# Patient Record
Sex: Male | Born: 1970 | Race: White | Hispanic: No | Smoking: Former smoker
Health system: Southern US, Community
[De-identification: ages and names within clinical notes are randomized; demographics above are authoritative.]

---

## 2007-11-05 ENCOUNTER — Inpatient Hospital Stay (HOSPITAL_COMMUNITY): Admission: EM | Admit: 2007-11-05 | Discharge: 2007-11-08 | Payer: Self-pay | Admitting: Emergency Medicine

## 2008-07-20 ENCOUNTER — Encounter: Admission: RE | Admit: 2008-07-20 | Discharge: 2008-07-20 | Payer: Self-pay | Admitting: Specialist

## 2009-01-27 ENCOUNTER — Encounter: Admission: RE | Admit: 2009-01-27 | Discharge: 2009-01-27 | Payer: Self-pay | Admitting: Family Medicine

## 2010-10-02 IMAGING — CT CT L SPINE W/O CM
3 of 6 series · 14 of 29 positions shown, 15 images · non-contrast
Comparison: Myelogram 07/20/2008

CLINICAL DATA: Follow-up discogram at outside institution.  Back
pain.

CT LUMBAR SPINE WITHOUT CONTRAST
TECHNIQUE: Multidetector CT imaging of the lumbar spine was
performed without intravenous contrast administration. Multiplanar
CT image reconstructions were also generated.

[Series 2: l-spine helical · axial · 0.27mm/px · z∈[-62,+21]mm · 4 of 55 slices shown, 5 images]
[im 11/55  soft-tissue]
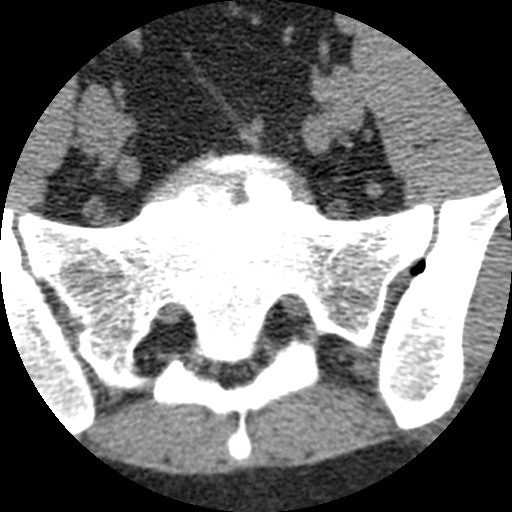
[im 11/55  bone]
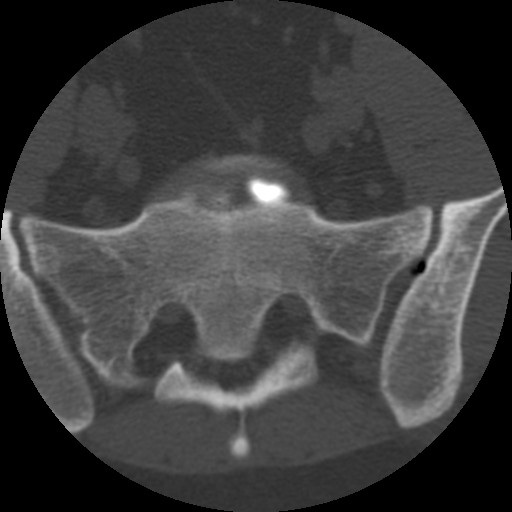
[im 22/55  bone]
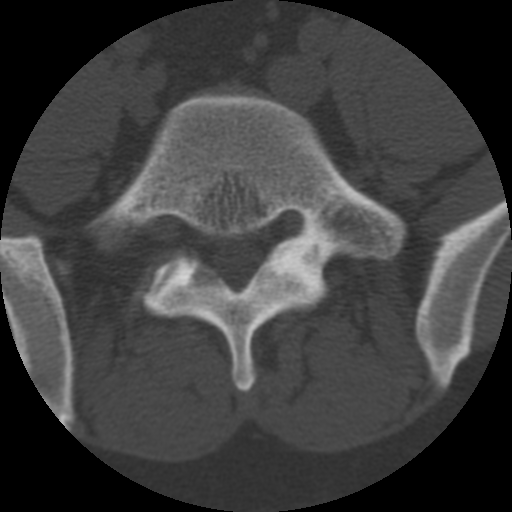
[im 33/55  bone]
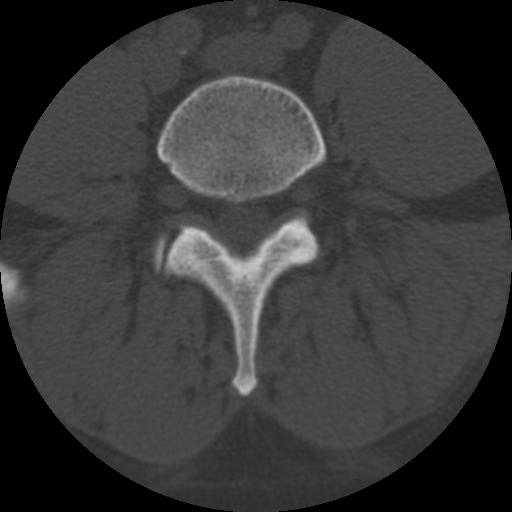
[im 44/55  bone]
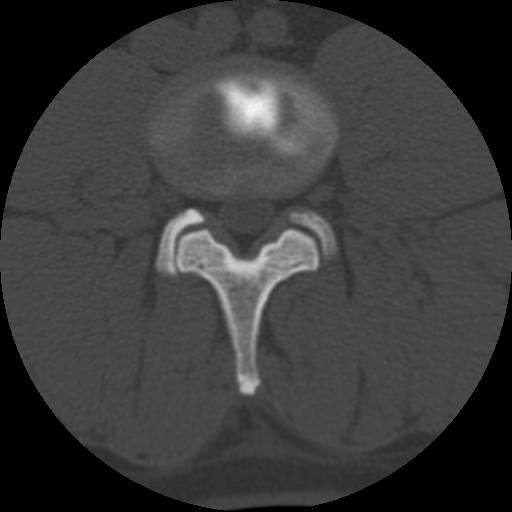

[Series 3: bone windows · axial · 0.27mm/px · z∈[-62,+21]mm · 4 of 55 slices shown]
[im 11/55  bone]
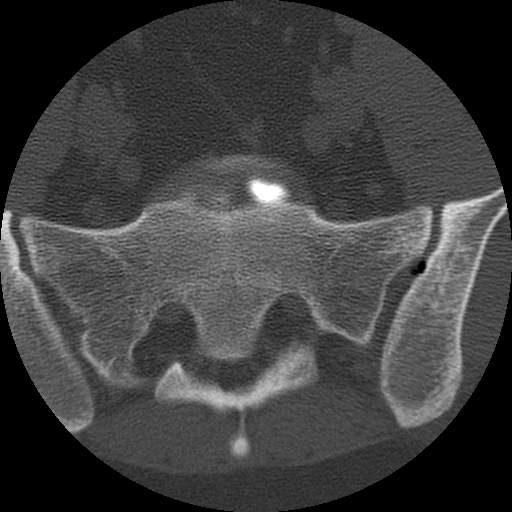
[im 22/55  bone]
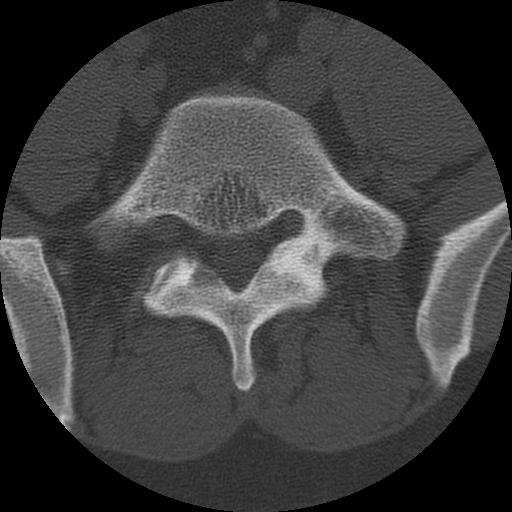
[im 33/55  bone]
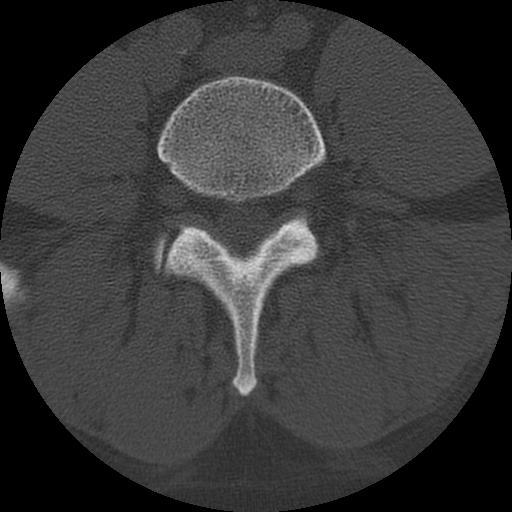
[im 44/55  bone]
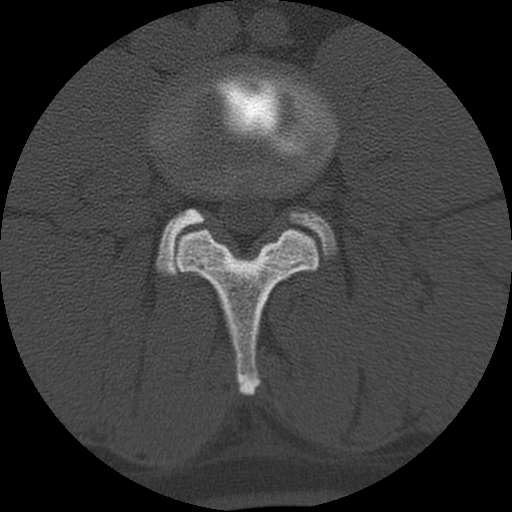

[Series 400: coronal · coronal · 0.27mm/px · 6 of 40 slices shown]
[im 2/40  soft-tissue]
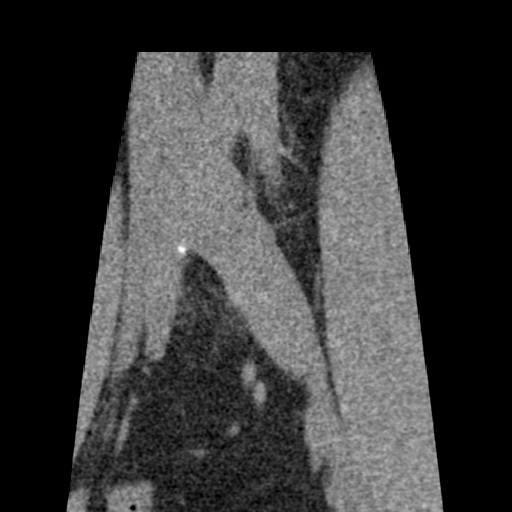
[im 7/40  bone]
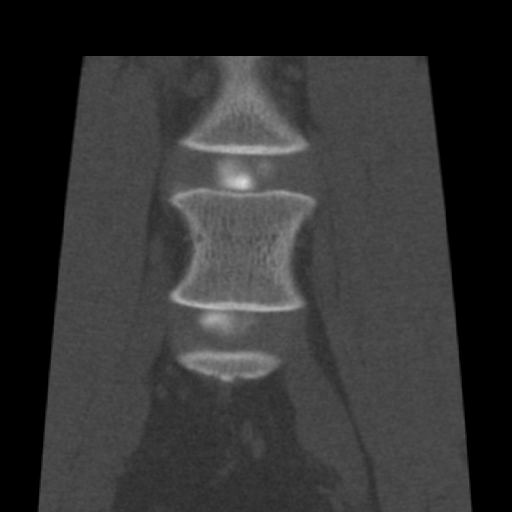
[im 14/40  bone]
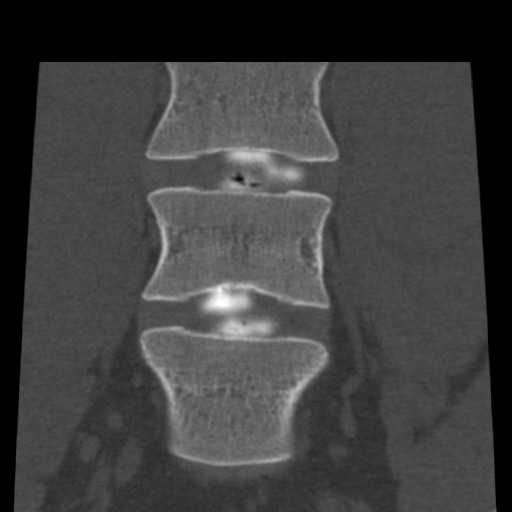
[im 20/40  bone]
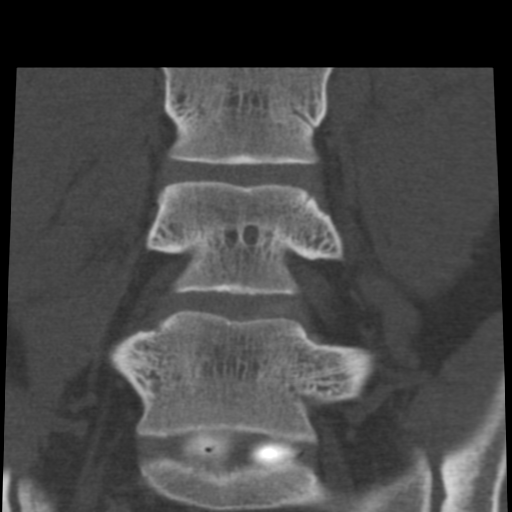
[im 27/40  bone]
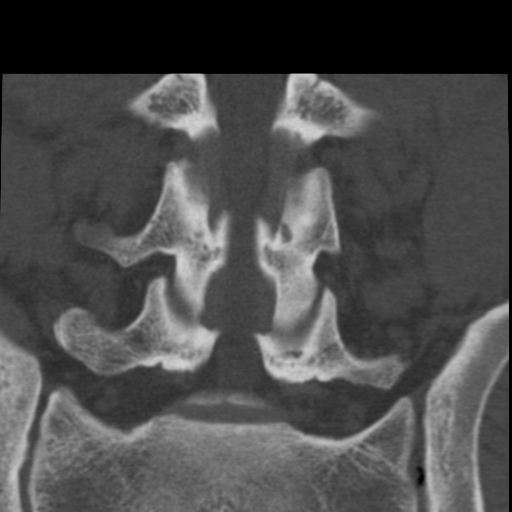
[im 33/40  bone]
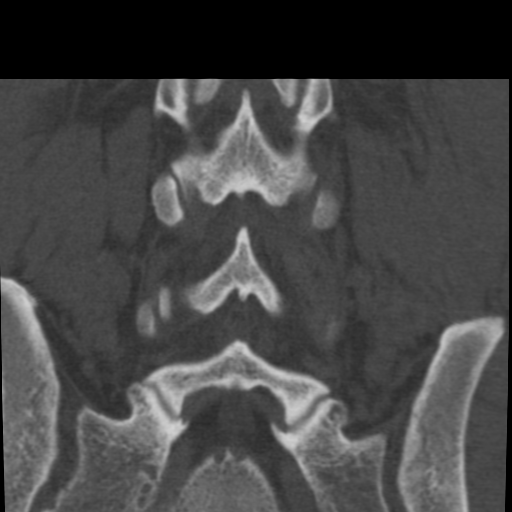

[14 of 29 positions shown; findings below may reference images not displayed]

FINDINGS: L3-4:  Normal nuclear pattern except for a small anterior
annular tear.  Canal and foramina widely patent.

L4-5:  Normal nuclear pattern.  Canal and foramina widely patent.

L5-S1:  The injection is partially annular on the left.  Contrast
does enter the nuclear region.  There appears to be annular tearing
towards the right at about seven to eight o'clock.  No canal or
foraminal compromise.
IMPRESSION: Normal nuclear patterns at L3-4 and L4-5 with the exception of some
anterior annular tearing at L3-4.

The L5-S1 injection is partially annular.  Contrast in the nuclear
region appears to demonstrate an annular tear on the right at seven
to eight o'clock.
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
## 2011-06-14 LAB — CBC
HCT: 41.5
Hemoglobin: 14.2
MCHC: 34.3
Platelets: 212
RDW: 12.4

## 2011-06-14 LAB — DIFFERENTIAL
Basophils Absolute: 0
Basophils Relative: 0
Eosinophils Absolute: 0
Lymphs Abs: 1.5
Monocytes Relative: 7
Neutro Abs: 8 — ABNORMAL HIGH
Neutrophils Relative %: 78 — ABNORMAL HIGH

## 2011-06-14 LAB — PROTIME-INR: Prothrombin Time: 12.2

## 2011-06-14 LAB — I-STAT 8, (EC8 V) (CONVERTED LAB)
BUN: 9
Bicarbonate: 26.8 — ABNORMAL HIGH
Chloride: 105
Glucose, Bld: 102 — ABNORMAL HIGH
HCT: 44
Hemoglobin: 15
Operator id: 222501
Potassium: 4.1
Sodium: 139
TCO2: 28
pCO2, Ven: 49.8
pH, Ven: 7.338 — ABNORMAL HIGH

## 2011-06-14 LAB — POCT I-STAT CREATININE
Creatinine, Ser: 0.9
Operator id: 222501

## 2011-06-14 LAB — ABO/RH: ABO/RH(D): A POS

## 2011-06-14 LAB — TYPE AND SCREEN: Antibody Screen: NEGATIVE

## 2013-05-14 DIAGNOSIS — M6140 Other calcification of muscle, unspecified site: Secondary | ICD-10-CM

## 2013-05-14 DIAGNOSIS — M5137 Other intervertebral disc degeneration, lumbosacral region: Secondary | ICD-10-CM

## 2013-05-14 DIAGNOSIS — F411 Generalized anxiety disorder: Secondary | ICD-10-CM

## 2013-05-14 DIAGNOSIS — K769 Liver disease, unspecified: Secondary | ICD-10-CM

## 2013-05-14 HISTORY — DX: Other calcification of muscle, unspecified site: M61.40

## 2013-05-14 HISTORY — DX: Liver disease, unspecified: K76.9

## 2013-05-14 HISTORY — DX: Other intervertebral disc degeneration, lumbosacral region: M51.37

## 2013-05-14 HISTORY — DX: Generalized anxiety disorder: F41.1

## 2013-06-16 DIAGNOSIS — F341 Dysthymic disorder: Secondary | ICD-10-CM | POA: Insufficient documentation

## 2013-06-16 DIAGNOSIS — F4329 Adjustment disorder with other symptoms: Secondary | ICD-10-CM

## 2013-06-16 HISTORY — DX: Dysthymic disorder: F34.1

## 2013-06-16 HISTORY — DX: Adjustment disorder with other symptoms: F43.29

## 2013-09-03 DIAGNOSIS — M545 Low back pain, unspecified: Secondary | ICD-10-CM

## 2013-09-03 DIAGNOSIS — M79609 Pain in unspecified limb: Secondary | ICD-10-CM

## 2013-09-03 HISTORY — DX: Pain in unspecified limb: M79.609

## 2013-09-03 HISTORY — DX: Low back pain, unspecified: M54.50

## 2013-09-10 DIAGNOSIS — F4541 Pain disorder exclusively related to psychological factors: Secondary | ICD-10-CM | POA: Insufficient documentation

## 2013-09-10 DIAGNOSIS — Z09 Encounter for follow-up examination after completed treatment for conditions other than malignant neoplasm: Secondary | ICD-10-CM | POA: Insufficient documentation

## 2013-09-10 DIAGNOSIS — F4542 Pain disorder with related psychological factors: Secondary | ICD-10-CM | POA: Insufficient documentation

## 2013-09-10 HISTORY — DX: Pain disorder exclusively related to psychological factors: F45.41

## 2013-09-10 HISTORY — DX: Pain disorder with related psychological factors: F45.42

## 2013-09-10 HISTORY — DX: Encounter for follow-up examination after completed treatment for conditions other than malignant neoplasm: Z09

## 2013-10-04 DIAGNOSIS — G894 Chronic pain syndrome: Secondary | ICD-10-CM

## 2013-10-04 HISTORY — DX: Chronic pain syndrome: G89.4

## 2014-03-31 DIAGNOSIS — R29898 Other symptoms and signs involving the musculoskeletal system: Secondary | ICD-10-CM

## 2014-03-31 DIAGNOSIS — M6289 Other specified disorders of muscle: Secondary | ICD-10-CM

## 2014-03-31 DIAGNOSIS — M25551 Pain in right hip: Secondary | ICD-10-CM

## 2014-03-31 HISTORY — DX: Other symptoms and signs involving the musculoskeletal system: R29.898

## 2014-03-31 HISTORY — DX: Other specified disorders of muscle: M62.89

## 2014-03-31 HISTORY — DX: Pain in right hip: M25.551

## 2014-07-26 DIAGNOSIS — Z0289 Encounter for other administrative examinations: Secondary | ICD-10-CM | POA: Insufficient documentation

## 2014-07-26 HISTORY — DX: Encounter for other administrative examinations: Z02.89

## 2014-09-28 DIAGNOSIS — Z5181 Encounter for therapeutic drug level monitoring: Secondary | ICD-10-CM

## 2014-09-28 HISTORY — DX: Encounter for therapeutic drug level monitoring: Z51.81

## 2016-01-15 DIAGNOSIS — G250 Essential tremor: Secondary | ICD-10-CM | POA: Insufficient documentation

## 2016-01-15 HISTORY — DX: Essential tremor: G25.0

## 2017-03-07 DIAGNOSIS — I10 Essential (primary) hypertension: Secondary | ICD-10-CM

## 2017-03-07 HISTORY — DX: Essential (primary) hypertension: I10

## 2017-08-08 DIAGNOSIS — D239 Other benign neoplasm of skin, unspecified: Secondary | ICD-10-CM

## 2017-08-08 HISTORY — DX: Other benign neoplasm of skin, unspecified: D23.9

## 2017-09-22 DIAGNOSIS — Z9689 Presence of other specified functional implants: Secondary | ICD-10-CM | POA: Insufficient documentation

## 2017-09-22 HISTORY — DX: Presence of other specified functional implants: Z96.89

## 2021-01-16 DIAGNOSIS — E785 Hyperlipidemia, unspecified: Secondary | ICD-10-CM | POA: Diagnosis not present

## 2021-01-16 DIAGNOSIS — R9439 Abnormal result of other cardiovascular function study: Secondary | ICD-10-CM | POA: Diagnosis not present

## 2021-01-16 DIAGNOSIS — R079 Chest pain, unspecified: Secondary | ICD-10-CM | POA: Diagnosis not present

## 2021-01-16 DIAGNOSIS — Z720 Tobacco use: Secondary | ICD-10-CM | POA: Diagnosis not present

## 2021-01-17 ENCOUNTER — Encounter (HOSPITAL_COMMUNITY): Admission: RE | Payer: Self-pay | Source: Home / Self Care

## 2021-01-17 ENCOUNTER — Ambulatory Visit (HOSPITAL_COMMUNITY): Admission: RE | Admit: 2021-01-17 | Payer: Medicare HMO | Source: Home / Self Care | Admitting: Cardiovascular Disease

## 2021-01-17 ENCOUNTER — Inpatient Hospital Stay
Admission: AD | Admit: 2021-01-17 | Payer: Medicare HMO | Source: Other Acute Inpatient Hospital | Admitting: Cardiovascular Disease

## 2021-01-17 DIAGNOSIS — R55 Syncope and collapse: Secondary | ICD-10-CM | POA: Diagnosis not present

## 2021-01-17 DIAGNOSIS — Z720 Tobacco use: Secondary | ICD-10-CM | POA: Diagnosis not present

## 2021-01-17 DIAGNOSIS — E785 Hyperlipidemia, unspecified: Secondary | ICD-10-CM | POA: Diagnosis not present

## 2021-01-17 DIAGNOSIS — R9439 Abnormal result of other cardiovascular function study: Secondary | ICD-10-CM

## 2021-01-17 DIAGNOSIS — R079 Chest pain, unspecified: Secondary | ICD-10-CM | POA: Diagnosis not present

## 2021-01-17 SURGERY — LEFT HEART CATH AND CORONARY ANGIOGRAPHY
Anesthesia: LOCAL

## 2021-01-31 ENCOUNTER — Other Ambulatory Visit: Payer: Self-pay

## 2021-01-31 ENCOUNTER — Ambulatory Visit: Payer: Medicare HMO | Admitting: Cardiology

## 2021-01-31 ENCOUNTER — Ambulatory Visit (INDEPENDENT_AMBULATORY_CARE_PROVIDER_SITE_OTHER): Payer: Medicare HMO

## 2021-01-31 ENCOUNTER — Encounter: Payer: Self-pay | Admitting: Cardiology

## 2021-01-31 VITALS — BP 110/88 | HR 76 | Ht 72.0 in | Wt 245.2 lb

## 2021-01-31 DIAGNOSIS — R9439 Abnormal result of other cardiovascular function study: Secondary | ICD-10-CM

## 2021-01-31 DIAGNOSIS — E782 Mixed hyperlipidemia: Secondary | ICD-10-CM | POA: Insufficient documentation

## 2021-01-31 DIAGNOSIS — R079 Chest pain, unspecified: Secondary | ICD-10-CM

## 2021-01-31 DIAGNOSIS — E669 Obesity, unspecified: Secondary | ICD-10-CM

## 2021-01-31 HISTORY — DX: Abnormal result of other cardiovascular function study: R94.39

## 2021-01-31 HISTORY — DX: Obesity, unspecified: E66.9

## 2021-01-31 HISTORY — DX: Chest pain, unspecified: R07.9

## 2021-01-31 HISTORY — DX: Mixed hyperlipidemia: E78.2

## 2021-01-31 MED ORDER — ASPIRIN EC 81 MG PO TBEC: 81.0000 mg | DELAYED_RELEASE_TABLET | Freq: Every day | ORAL | 3 refills | Status: DC

## 2021-01-31 MED ORDER — ROSUVASTATIN CALCIUM 20 MG PO TABS: 20.0000 mg | ORAL_TABLET | Freq: Every day | ORAL | 3 refills | Status: DC

## 2021-01-31 MED ORDER — ISOSORBIDE MONONITRATE ER 30 MG PO TB24: 15.0000 mg | ORAL_TABLET | Freq: Every day | ORAL | 3 refills | Status: DC

## 2021-01-31 NOTE — Patient Instructions (Addendum)
Medication Instructions:  Your physician has recommended you make the following change in your medication:  START: Crestor 20 mg once daily START: Imdur 15 mg once daily START: Aspirin 81 mg once daily *If you need a refill on your cardiac medications before your next appointment, please call your pharmacy*   Lab Work: Your physician recommends that you return for lab work:  TODAY: BMET, Mag, CBC If you have labs (blood work) drawn today and your tests are completely normal, you will receive your results only by: Marland Kitchen MyChart Message (if you have MyChart) OR . A paper copy in the mail If you have any lab test that is abnormal or we need to change your treatment, we will call you to review the results.   Testing/Procedures: Your physician has requested that you have an echocardiogram. Echocardiography is a painless test that uses sound waves to create images of your heart. It provides your doctor with information about the size and shape of your heart and how well your heart's chambers and valves are working. This procedure takes approximately one hour. There are no restrictions for this procedure.  A zio monitor was ordered today. It will remain on for 7 days. You will then return monitor and event diary in provided box. It takes 1-2 weeks for report to be downloaded and returned to [place]. We will call you with the results. If monitor falls off or has orange flashing light, please call Zio for further instructions.      [name] [address] Dept: [phone] Loc: [phone]  [id number]  01/31/2021  You are scheduled for a Cardiac Catheterization on Friday, May 13 with Dr. [name].  1. Please arrive at the [facility] (Main Entrance A) at [facility]: [address] [place], [place] [address] at 5:30 AM (This time is two hours before your procedure to ensure your  preparation). Free valet parking service is available.   Special note: Every effort is made to have your procedure done on time. Please understand that emergencies sometimes delay scheduled procedures.  2. Diet: Do not eat solid foods after midnight.  The patient may have clear liquids until 5am upon the day of the procedure.  3. Labs: You will need to have blood drawn on TODAY  4. Medication instructions in preparation for your procedure:   Contrast Allergy: No   On the morning of your procedure, take your Aspirin and any morning medicines NOT listed above.  You may use sips of water.  5. Plan for one night stay--bring personal belongings. 6. Bring a current list of your medications and current insurance cards. 7. You MUST have a responsible person to drive you home. 8. Someone MUST be with you the first 24 hours after you arrive home or your discharge will be delayed. 9. Please wear clothes that are easy to get on and off and wear slip-on shoes.  Thank you for allowing [place] to care for you!   -- [facility]    Follow-Up: At [facility], you and your health needs are our priority.  As part of our continuing mission to provide you with exceptional heart care, we have created designated Provider Care Teams.  These Care Teams include your primary Cardiologist (physician) and Advanced Practice Providers (APPs -  Physician Assistants and Nurse Practitioners) who all work together to provide you with the care you need, when  you need it.  We recommend signing up for the patient portal called "MyChart".  Sign up information is provided on this After Visit Summary.  MyChart is used to connect with patients for Virtual Visits (Telemedicine).  Patients are able to view lab/test results, encounter notes, upcoming appointments, etc.  Non-urgent messages can be sent to your provider as well.   To learn more about what you can do with MyChart, go to  [email or web address].    Your next appointment:   2 week(s) after Cath  The format for your next appointment:   In Person  Provider:   [name], DO   Other Instructions Echocardiogram An echocardiogram is a test that uses sound waves (ultrasound) to produce images of the heart. Images from an echocardiogram can provide important information about:  Heart size and shape.  The size and thickness and movement of your heart's walls.  Heart muscle function and strength.  Heart valve function or if you have stenosis. Stenosis is when the heart valves are too narrow.  If blood is flowing backward through the heart valves (regurgitation).  A tumor or infectious growth around the heart valves.  Areas of heart muscle that are not working well because of poor blood flow or injury from a heart attack.  Aneurysm detection. An aneurysm is a weak or damaged part of an artery wall. The wall bulges out from the normal force of blood pumping through the body. Tell a health care provider about:  Any allergies you have.  All medicines you are taking, including vitamins, herbs, eye drops, creams, and over-the-counter medicines.  Any blood disorders you have.  Any surgeries you have had.  Any medical conditions you have.  Whether you are pregnant or may be pregnant. What are the risks? Generally, this is a safe test. However, problems may occur, including an allergic reaction to dye (contrast) that may be used during the test. What happens before the test? No specific preparation is needed. You may eat and drink normally. What happens during the test?  You will take off your clothes from the waist up and put on a hospital gown.  Electrodes or electrocardiogram (ECG)patches may be placed on your chest. The electrodes or patches are then connected to a device that monitors your heart rate and rhythm.  You will lie down on a table for an ultrasound exam. A gel will be applied to  your chest to help sound waves pass through your skin.  A handheld device, called a transducer, will be pressed against your chest and moved over your heart. The transducer produces sound waves that travel to your heart and bounce back (or "echo" back) to the transducer. These sound waves will be captured in real-time and changed into images of your heart that can be viewed on a video monitor. The images will be recorded on a computer and reviewed by your health care provider.  You may be asked to change positions or hold your breath for a short time. This makes it easier to get different views or better views of your heart.  In some cases, you may receive contrast through an IV in one of your veins. This can improve the quality of the pictures from your heart. The procedure may vary among health care providers and hospitals.   What can I expect after the test? You may return to your normal, everyday life, including diet, activities, and medicines, unless your health care provider tells you not to do that. Follow  these instructions at home:  It is up to you to get the results of your test. Ask your health care provider, or the department that is doing the test, when your results will be ready.  Keep all follow-up visits. This is important. Summary  An echocardiogram is a test that uses sound waves (ultrasound) to produce images of the heart.  Images from an echocardiogram can provide important information about the size and shape of your heart, heart muscle function, heart valve function, and other possible heart problems.  You do not need to do anything to prepare before this test. You may eat and drink normally.  After the echocardiogram is completed, you may return to your normal, everyday life, unless your health care provider tells you not to do that. This information is not intended to replace advice given to you by your health care provider. Make sure you discuss any questions you have  with your health care provider. Document Revised: 05/02/2020 Document Reviewed: 05/02/2020 Elsevier Patient Education  2021 [place] DOCTOR PRESCRIBING ZIO? The Zio system is proven and trusted by physicians to detect and diagnose irregular heart rhythms -- and has been prescribed to hundreds of thousands of patients.  The FDA has cleared the Zio system to monitor for many different kinds of irregular heart rhythms. In a study, physicians were able to reach a diagnosis 90% of the time with the Zio system1.  You can wear the Zio monitor -- a small, discreet, comfortable patch -- during your normal day-to-day activity, including while you sleep, shower, and exercise, while it records every single heartbeat for analysis.  1Barrett, P., et al. Comparison of 24 Hour Holter Monitoring Versus 14 Day Novel Adhesive Patch Electrocardiographic Monitoring. [place], 2014.  ZIO VS. HOLTER MONITORING The Zio monitor can be comfortably worn for up to 7 days. Holter monitors can be worn for 24 to 48 hours, limiting the time to record any irregular heart rhythms you may have. Zio is able to capture data for the 51% of patients who have their first symptom-triggered arrhythmia after 48 hours.1  LIVE WITHOUT RESTRICTIONS The Zio ambulatory cardiac monitor is a small, unobtrusive, and water-resistant patch--you might even forget you're wearing it. The Zio monitor records and stores every beat of your heart, whether you're sleeping, working out, or showering.

## 2021-01-31 NOTE — Progress Notes (Signed)
Cardiology Office Note:    Date:  01/31/2021   ID:  Ian Mathis, DOB [birth year], MRN [id number]  PCP:  Rubie Maid, MD  Cardiologist:  [name], DO  Electrophysiologist:  None   Referring MD: No ref. provider found   I am having a lot of chest pain.  History of Present Illness:    Ian Mathis is a 50 y.o. male with a hx of hypertension, former smoker is here today for posthospitalization visit.  The patient was recently admitted to the [facility] where he presented after syncope episode.  During his hospitalization there was concern that ACS may be playing a role he underwent a nuclear stress test which showed normal EF but concern for small area of inducible ischemia in the anterior apical wall.  The patient was set for left heart catheterization however his COVID test came back positive he would therefore discharge to home.  He is here today for follow-up visit.  The patient tells me he is experiencing intermittent chest discomfort.  He describes it as a pressure-like sensation in his midsternal area.  Sometimes it radiates to his left arm.  And sometimes he is short of breath on exertion.  He notes that at the days get by these feelings continue to get worse.  History reviewed. No pertinent past medical history.  History reviewed. No pertinent surgical history.  Current Medications: Current Meds  Medication Sig  . aspirin EC 81 MG tablet Take 1 tablet (81 mg total) by mouth daily. Swallow whole.  . baclofen (LIORESAL) 10 MG tablet Take 10 mg by mouth 3 (three) times daily.  . clopidogrel (PLAVIX) 75 MG tablet Take 1 tablet by mouth daily.  Marland Kitchen FLUoxetine (PROZAC) 20 MG capsule Take 20 mg by mouth every morning.  Marland Kitchen HYDROcodone-acetaminophen (NORCO) 10-325 MG tablet Take 1 tablet by mouth in the morning, at noon, and at bedtime.  . isosorbide mononitrate (IMDUR) 30 MG 24 hr tablet Take 0.5 tablets (15 mg total) by mouth daily.  . meloxicam (MOBIC) 15 MG  tablet Take 1 tablet by mouth daily.  . metoprolol tartrate (LOPRESSOR) 25 MG tablet Take 25 mg by mouth 2 (two) times daily.  Marland Kitchen morphine (MS CONTIN) 30 MG 12 hr tablet Take 30 mg by mouth 2 (two) times daily as needed for pain.  . nitroGLYCERIN (NITROSTAT) 0.3 MG SL tablet Place 1 tablet under the tongue as needed for chest pain.  Marland Kitchen OLANZapine (ZYPREXA) 5 MG tablet Take 5 mg by mouth at bedtime.  . rosuvastatin (CRESTOR) 20 MG tablet Take 1 tablet (20 mg total) by mouth daily.  . traZODone (DESYREL) 150 MG tablet Take 300 mg by mouth daily.     Allergies:   Atorvastatin, Zolpidem tartrate, and Zolpidem   Social History   Socioeconomic History  . Marital status: Significant Other    Spouse name: Not on file  . Number of children: Not on file  . Years of education: Not on file  . Highest education level: Not on file  Occupational History  . Not on file  Tobacco Use  . Smoking status: Not on file  . Smokeless tobacco: Not on file  Substance and Sexual Activity  . Alcohol use: Not on file  . Drug use: Not on file  . Sexual activity: Not on file  Other Topics Concern  . Not on file  Social History Narrative  . Not on file   Social Determinants of Health   Financial Resource Strain: Not on  file  Food Insecurity: Not on file  Transportation Needs: Not on file  Physical Activity: Not on file  Stress: Not on file  Social Connections: Not on file     Family History: The patient's family history is not on file.  ROS:   Review of Systems  Constitution: Negative for decreased appetite, fever and weight gain.  HENT: Negative for congestion, ear discharge, hoarse voice and sore throat.   Eyes: Negative for discharge, redness, vision loss in right eye and visual halos.  Cardiovascular: Negative for chest pain, dyspnea on exertion, leg swelling, orthopnea and palpitations.  Respiratory: Negative for cough, hemoptysis, shortness of breath and snoring.   Endocrine: Negative for  heat intolerance and polyphagia.  Hematologic/Lymphatic: Negative for bleeding problem. Does not bruise/bleed easily.  Skin: Negative for flushing, nail changes, rash and suspicious lesions.  Musculoskeletal: Negative for arthritis, joint pain, muscle cramps, myalgias, neck pain and stiffness.  Gastrointestinal: Negative for abdominal pain, bowel incontinence, diarrhea and excessive appetite.  Genitourinary: Negative for decreased libido, genital sores and incomplete emptying.  Neurological: Negative for brief paralysis, focal weakness, headaches and loss of balance.  Psychiatric/Behavioral: Negative for altered mental status, depression and suicidal ideas.  Allergic/Immunologic: Negative for HIV exposure and persistent infections.    EKGs/Labs/Other Studies Reviewed:    The following studies were reviewed today:   EKG:  The ekg ordered today demonstrates sinus rhythm, heart rate 76 bpm.  CT scan of the chest done on January 16, 2021 shows no acute pulmonary embolism.  No acute intrathoracic pathology identified.  18 mm right thyroid nodule.  Ultrasound of the thyroid was recommended.  Chest x-ray done January 15, 2021 showed chronic cardiac enlargement with central vascular prominence.  Nuclear stress test done January 16, 2021 shows small area of pharmacologically induced ischemia involving the apical aspect of anterior wall of the left ventricle.  No evidence of prior myocardial infarction.  Normal wall motion ejection fraction 65%.  Recent Labs: No results found for requested labs within last 8760 hours.  Recent Lipid Panel No results found for: CHOL, TRIG, HDL, CHOLHDL, VLDL, LDLCALC, LDLDIRECT  Physical Exam:    VS:  BP 110/88   Pulse 76   Ht 6' (1.829 m)   Wt 245 lb 3.2 oz (111.2 kg)   SpO2 97%   BMI 33.26 kg/m     Wt Readings from Last 3 Encounters:  01/31/21 245 lb 3.2 oz (111.2 kg)     GEN: Well nourished, well developed in no acute distress HEENT: Normal NECK: No  JVD; No carotid bruits LYMPHATICS: No lymphadenopathy CARDIAC: S1S2 noted,RRR, no murmurs, rubs, gallops RESPIRATORY:  Clear to auscultation without rales, wheezing or rhonchi  ABDOMEN: Soft, non-tender, non-distended, +bowel sounds, no guarding. EXTREMITIES: No edema, No cyanosis, no clubbing MUSCULOSKELETAL:  No deformity  SKIN: Warm and dry NEUROLOGIC:  Alert and oriented x 3, non-focal PSYCHIATRIC:  Normal affect, good insight  ASSESSMENT:    1. Abnormal stress test   2. Chest pain of uncertain etiology   3. Mixed hyperlipidemia   4. Obesity (BMI 30-39.9)    PLAN:    His stress test was abnormal the patient is still experiencing worsening symptoms of chest discomfort as well as shortness of breath which could be part of his anginal equivalents.  He has risk factors for coronary artery disease.   At this time the best next step will be to refer the patient for a left heart catheterization.  In the meantime he has stopped his  aspirin and statin, therefore we will restart his aspirin 81 mg daily along with the Plavix 75 mg daily.  I will restart his Crestor 20 mg daily.  The patient understands that risks include but are not limited to stroke (1 in 1000), death (1 in 47), kidney failure [usually temporary] (1 in 500), bleeding (1 in 200), allergic reaction [possibly serious] (1 in 200), and agrees to proceed.  Asked the patient not to stop any of these medications for now.  In addition given his chest pain symptoms we will add Imdur 15 mg daily.  Blood pressure acceptable in the office today no changes will be made.  The patient is in agreement with the above plan. The patient left the office in stable condition.  The patient will follow up in 2 weeks for heart catheterization.   Medication Adjustments/Labs and Tests Ordered: Current medicines are reviewed at length with the patient today.  Concerns regarding medicines are outlined above.  Orders Placed This Encounter   Procedures  . Basic metabolic panel  . CBC with Differential/Platelet  . Magnesium  . LONG TERM MONITOR-LIVE TELEMETRY (3-14 DAYS)  . ECHOCARDIOGRAM COMPLETE   Meds ordered this encounter  Medications  . isosorbide mononitrate (IMDUR) 30 MG 24 hr tablet    Sig: Take 0.5 tablets (15 mg total) by mouth daily.    Dispense:  45 tablet    Refill:  3  . aspirin EC 81 MG tablet    Sig: Take 1 tablet (81 mg total) by mouth daily. Swallow whole.    Dispense:  90 tablet    Refill:  3  . rosuvastatin (CRESTOR) 20 MG tablet    Sig: Take 1 tablet (20 mg total) by mouth daily.    Dispense:  90 tablet    Refill:  3    Patient Instructions   Medication Instructions:  Your physician has recommended you make the following change in your medication:  START: Crestor 20 mg once daily START: Imdur 15 mg once daily START: Aspirin 81 mg once daily *If you need a refill on your cardiac medications before your next appointment, please call your pharmacy*   Lab Work: Your physician recommends that you return for lab work:  TODAY: BMET, Mag, CBC If you have labs (blood work) drawn today and your tests are completely normal, you will receive your results only by: Marland Kitchen MyChart Message (if you have MyChart) OR . A paper copy in the mail If you have any lab test that is abnormal or we need to change your treatment, we will call you to review the results.   Testing/Procedures: Your physician has requested that you have an echocardiogram. Echocardiography is a painless test that uses sound waves to create images of your heart. It provides your doctor with information about the size and shape of your heart and how well your heart's chambers and valves are working. This procedure takes approximately one hour. There are no restrictions for this procedure.  A zio monitor was ordered today. It will remain on for 7 days. You will then return monitor and event diary in provided box. It takes 1-2 weeks for report  to be downloaded and returned to [place]. We will call you with the results. If monitor falls off or has orange flashing light, please call Zio for further instructions.      [address] [place] [address] Dept: [phone] Loc: [place]  [place]  01/31/2021  You are scheduled for a Cardiac Catheterization on Friday, May 13 with Dr. [name].  1. Please arrive at the [facility] (Main Entrance A) at [facility]: [address] [place], [place] [address] at 5:30 AM (This time is two hours before your procedure to ensure your preparation). Free valet parking service is available.   Special note: Every effort is made to have your procedure done on time. Please understand that emergencies sometimes delay scheduled procedures.  2. Diet: Do not eat solid foods after midnight.  The patient may have clear liquids until 5am upon the day of the procedure.  3. Labs: You will need to have blood drawn on TODAY  4. Medication instructions in preparation for your procedure:   Contrast Allergy: No   On the morning of your procedure, take your Aspirin and any morning medicines NOT listed above.  You may use sips of water.  5. Plan for one night stay--bring personal belongings. 6. Bring a current list of your medications and current insurance cards. 7. You MUST have a responsible person to drive you home. 8. Someone MUST be with you the first 24 hours after you arrive home or your discharge will be delayed. 9. Please wear clothes that are easy to get on and off and wear slip-on shoes.  Thank you for allowing [place] to care for you!   -- [facility]    Follow-Up: At [facility], you and your health needs are our priority.  As part of our continuing mission to provide you with exceptional heart care, we have created designated Provider Care Teams.   These Care Teams include your primary Cardiologist (physician) and Advanced Practice Providers (APPs -  Physician Assistants and Nurse Practitioners) who all work together to provide you with the care you need, when you need it.  We recommend signing up for the patient portal called "MyChart".  Sign up information is provided on this After Visit Summary.  MyChart is used to connect with patients for Virtual Visits (Telemedicine).  Patients are able to view lab/test results, encounter notes, upcoming appointments, etc.  Non-urgent messages can be sent to your provider as well.   To learn more about what you can do with MyChart, go to [email or web address].    Your next appointment:   2 week(s) after Cath  The format for your next appointment:   In Person  Provider:   [name], DO   Other Instructions Echocardiogram An echocardiogram is a test that uses sound waves (ultrasound) to produce images of the heart. Images from an echocardiogram can provide important information about:  Heart size and shape.  The size and thickness and movement of your heart's walls.  Heart muscle function and strength.  Heart valve function or if you have stenosis. Stenosis is when the heart valves are too narrow.  If blood is flowing backward through the heart valves (regurgitation).  A tumor or infectious growth around the heart valves.  Areas of heart muscle that are not working well because of poor blood flow or injury from a heart attack.  Aneurysm detection. An aneurysm is a weak or damaged part of an artery wall. The wall bulges out from the normal force of blood pumping through the body. Tell a health care provider about:  Any allergies you have.  All medicines you are taking, including vitamins, herbs, eye drops, creams, and over-the-counter medicines.  Any blood disorders you have.  Any surgeries  you have had.  Any medical conditions you have.  Whether you are pregnant or may be  pregnant. What are the risks? Generally, this is a safe test. However, problems may occur, including an allergic reaction to dye (contrast) that may be used during the test. What happens before the test? No specific preparation is needed. You may eat and drink normally. What happens during the test?  You will take off your clothes from the waist up and put on a hospital gown.  Electrodes or electrocardiogram (ECG)patches may be placed on your chest. The electrodes or patches are then connected to a device that monitors your heart rate and rhythm.  You will lie down on a table for an ultrasound exam. A gel will be applied to your chest to help sound waves pass through your skin.  A handheld device, called a transducer, will be pressed against your chest and moved over your heart. The transducer produces sound waves that travel to your heart and bounce back (or "echo" back) to the transducer. These sound waves will be captured in real-time and changed into images of your heart that can be viewed on a video monitor. The images will be recorded on a computer and reviewed by your health care provider.  You may be asked to change positions or hold your breath for a short time. This makes it easier to get different views or better views of your heart.  In some cases, you may receive contrast through an IV in one of your veins. This can improve the quality of the pictures from your heart. The procedure may vary among health care providers and hospitals.   What can I expect after the test? You may return to your normal, everyday life, including diet, activities, and medicines, unless your health care provider tells you not to do that. Follow these instructions at home:  It is up to you to get the results of your test. Ask your health care provider, or the department that is doing the test, when your results will be ready.  Keep all follow-up visits. This is important. Summary  An echocardiogram is  a test that uses sound waves (ultrasound) to produce images of the heart.  Images from an echocardiogram can provide important information about the size and shape of your heart, heart muscle function, heart valve function, and other possible heart problems.  You do not need to do anything to prepare before this test. You may eat and drink normally.  After the echocardiogram is completed, you may return to your normal, everyday life, unless your health care provider tells you not to do that. This information is not intended to replace advice given to you by your health care provider. Make sure you discuss any questions you have with your health care provider. Document Revised: 05/02/2020 Document Reviewed: 05/02/2020 Elsevier Patient Education  2021 [place] DOCTOR PRESCRIBING ZIO? The Zio system is proven and trusted by physicians to detect and diagnose irregular heart rhythms -- and has been prescribed to hundreds of thousands of patients.  The FDA has cleared the Zio system to monitor for many different kinds of irregular heart rhythms. In a study, physicians were able to reach a diagnosis 90% of the time with the Zio system1.  You can wear the Zio monitor -- a small, discreet, comfortable patch -- during your normal day-to-day activity, including while you sleep, shower, and exercise, while it records every single heartbeat for analysis.  1Barrett, P., et al. Comparison of 24 Hour Holter Monitoring Versus 14 Day Novel Adhesive Patch Electrocardiographic Monitoring. [place], 2014.  ZIO VS. HOLTER MONITORING The Zio monitor can be comfortably worn for up to 7 days. Holter monitors can be worn for 24 to 48 hours, limiting the time to record any irregular heart rhythms you may have. Zio is able to capture data for the 51% of patients who have their first symptom-triggered arrhythmia after 48 hours.1  LIVE WITHOUT RESTRICTIONS The Zio ambulatory  cardiac monitor is a small, unobtrusive, and water-resistant patch--you might even forget you're wearing it. The Zio monitor records and stores every beat of your heart, whether you're sleeping, working out, or showering.      Adopting a Healthy Lifestyle.  Know what a healthy weight is for you (roughly BMI <25) and aim to maintain this   Aim for 7+ servings of fruits and vegetables daily   65-80+ fluid ounces of water or unsweet tea for healthy kidneys   Limit to max 1 drink of alcohol per day; avoid smoking/tobacco   Limit animal fats in diet for cholesterol and heart health - choose grass fed whenever available   Avoid highly processed foods, and foods high in saturated/trans fats   Aim for low stress - take time to unwind and care for your mental health   Aim for 150 min of moderate intensity exercise weekly for heart health, and weights twice weekly for bone health   Aim for 7-9 hours of sleep daily   When it comes to diets, agreement about the perfect plan isnt easy to find, even among the experts. Experts at the [employer] developed an idea known as the Healthy Eating Plate. Just imagine a plate divided into logical, healthy portions.   The emphasis is on diet quality:   Load up on vegetables and fruits - one-half of your plate: Aim for color and variety, and remember that potatoes dont count.   Go for whole grains - one-quarter of your plate: Whole wheat, barley, wheat berries, quinoa, oats, brown rice, and foods made with them. If you want pasta, go with whole wheat pasta.   Protein power - one-quarter of your plate: Fish, chicken, beans, and nuts are all healthy, versatile protein sources. Limit red meat.   The diet, however, does go beyond the plate, offering a few other suggestions.   Use healthy plant oils, such as olive, canola, soy, corn, sunflower and peanut. Check the labels, and avoid partially hydrogenated oil, which have unhealthy trans  fats.   If youre thirsty, drink water. Coffee and tea are good in moderation, but skip sugary drinks and limit milk and dairy products to one or two daily servings.   The type of carbohydrate in the diet is more important than the amount. Some sources of carbohydrates, such as vegetables, fruits, whole grains, and beans-are healthier than others.   Finally, stay active  Signed, [name], DO  01/31/2021 1:07 PM    [facility]

## 2021-01-31 NOTE — H&P (View-Only) (Signed)
Cardiology Office Note:    Date:  01/31/2021   ID:  Ian Mathis, DOB 02/15/1971, MRN [id number]  PCP:  Rubie Maid, MD  Cardiologist:  [name], DO  Electrophysiologist:  None   Referring MD: No ref. provider found   I am having a lot of chest pain.  History of Present Illness:    Ian Mathis is a 50 y.o. male with a hx of hypertension, former smoker is here today for posthospitalization visit.  The patient was recently admitted to the [facility] where he presented after syncope episode.  During his hospitalization there was concern that ACS may be playing a role he underwent a nuclear stress test which showed normal EF but concern for small area of inducible ischemia in the anterior apical wall.  The patient was set for left heart catheterization however his COVID test came back positive he would therefore discharge to home.  He is here today for follow-up visit.  The patient tells me he is experiencing intermittent chest discomfort.  He describes it as a pressure-like sensation in his midsternal area.  Sometimes it radiates to his left arm.  And sometimes he is short of breath on exertion.  He notes that at the days get by these feelings continue to get worse.  History reviewed. No pertinent past medical history.  History reviewed. No pertinent surgical history.  Current Medications: Current Meds  Medication Sig  . aspirin EC 81 MG tablet Take 1 tablet (81 mg total) by mouth daily. Swallow whole.  . baclofen (LIORESAL) 10 MG tablet Take 10 mg by mouth 3 (three) times daily.  . clopidogrel (PLAVIX) 75 MG tablet Take 1 tablet by mouth daily.  Marland Kitchen FLUoxetine (PROZAC) 20 MG capsule Take 20 mg by mouth every morning.  Marland Kitchen HYDROcodone-acetaminophen (NORCO) 10-325 MG tablet Take 1 tablet by mouth in the morning, at noon, and at bedtime.  . isosorbide mononitrate (IMDUR) 30 MG 24 hr tablet Take 0.5 tablets (15 mg total) by mouth daily.  . meloxicam (MOBIC) 15 MG  tablet Take 1 tablet by mouth daily.  . metoprolol tartrate (LOPRESSOR) 25 MG tablet Take 25 mg by mouth 2 (two) times daily.  Marland Kitchen morphine (MS CONTIN) 30 MG 12 hr tablet Take 30 mg by mouth 2 (two) times daily as needed for pain.  . nitroGLYCERIN (NITROSTAT) 0.3 MG SL tablet Place 1 tablet under the tongue as needed for chest pain.  Marland Kitchen OLANZapine (ZYPREXA) 5 MG tablet Take 5 mg by mouth at bedtime.  . rosuvastatin (CRESTOR) 20 MG tablet Take 1 tablet (20 mg total) by mouth daily.  . traZODone (DESYREL) 150 MG tablet Take 300 mg by mouth daily.     Allergies:   Atorvastatin, Zolpidem tartrate, and Zolpidem   Social History   Socioeconomic History  . Marital status: Significant Other    Spouse name: Not on file  . Number of children: Not on file  . Years of education: Not on file  . Highest education level: Not on file  Occupational History  . Not on file  Tobacco Use  . Smoking status: Not on file  . Smokeless tobacco: Not on file  Substance and Sexual Activity  . Alcohol use: Not on file  . Drug use: Not on file  . Sexual activity: Not on file  Other Topics Concern  . Not on file  Social History Narrative  . Not on file   Social Determinants of Health   Financial Resource Strain: Not on  file  Food Insecurity: Not on file  Transportation Needs: Not on file  Physical Activity: Not on file  Stress: Not on file  Social Connections: Not on file     Family History: The patient's family history is not on file.  ROS:   Review of Systems  Constitution: Negative for decreased appetite, fever and weight gain.  HENT: Negative for congestion, ear discharge, hoarse voice and sore throat.   Eyes: Negative for discharge, redness, vision loss in right eye and visual halos.  Cardiovascular: Negative for chest pain, dyspnea on exertion, leg swelling, orthopnea and palpitations.  Respiratory: Negative for cough, hemoptysis, shortness of breath and snoring.   Endocrine: Negative for  heat intolerance and polyphagia.  Hematologic/Lymphatic: Negative for bleeding problem. Does not bruise/bleed easily.  Skin: Negative for flushing, nail changes, rash and suspicious lesions.  Musculoskeletal: Negative for arthritis, joint pain, muscle cramps, myalgias, neck pain and stiffness.  Gastrointestinal: Negative for abdominal pain, bowel incontinence, diarrhea and excessive appetite.  Genitourinary: Negative for decreased libido, genital sores and incomplete emptying.  Neurological: Negative for brief paralysis, focal weakness, headaches and loss of balance.  Psychiatric/Behavioral: Negative for altered mental status, depression and suicidal ideas.  Allergic/Immunologic: Negative for HIV exposure and persistent infections.    EKGs/Labs/Other Studies Reviewed:    The following studies were reviewed today:   EKG:  The ekg ordered today demonstrates sinus rhythm, heart rate 76 bpm.  CT scan of the chest done on January 16, 2021 shows no acute pulmonary embolism.  No acute intrathoracic pathology identified.  18 mm right thyroid nodule.  Ultrasound of the thyroid was recommended.  Chest x-ray done January 15, 2021 showed chronic cardiac enlargement with central vascular prominence.  Nuclear stress test done January 16, 2021 shows small area of pharmacologically induced ischemia involving the apical aspect of anterior wall of the left ventricle.  No evidence of prior myocardial infarction.  Normal wall motion ejection fraction 65%.  Recent Labs: No results found for requested labs within last 8760 hours.  Recent Lipid Panel No results found for: CHOL, TRIG, HDL, CHOLHDL, VLDL, LDLCALC, LDLDIRECT  Physical Exam:    VS:  BP 110/88   Pulse 76   Ht 6' (1.829 m)   Wt 245 lb 3.2 oz (111.2 kg)   SpO2 97%   BMI 33.26 kg/m     Wt Readings from Last 3 Encounters:  01/31/21 245 lb 3.2 oz (111.2 kg)     GEN: Well nourished, well developed in no acute distress HEENT: Normal NECK: No  JVD; No carotid bruits LYMPHATICS: No lymphadenopathy CARDIAC: S1S2 noted,RRR, no murmurs, rubs, gallops RESPIRATORY:  Clear to auscultation without rales, wheezing or rhonchi  ABDOMEN: Soft, non-tender, non-distended, +bowel sounds, no guarding. EXTREMITIES: No edema, No cyanosis, no clubbing MUSCULOSKELETAL:  No deformity  SKIN: Warm and dry NEUROLOGIC:  Alert and oriented x 3, non-focal PSYCHIATRIC:  Normal affect, good insight  ASSESSMENT:    1. Abnormal stress test   2. Chest pain of uncertain etiology   3. Mixed hyperlipidemia   4. Obesity (BMI 30-39.9)    PLAN:    His stress test was abnormal the patient is still experiencing worsening symptoms of chest discomfort as well as shortness of breath which could be part of his anginal equivalents.  He has risk factors for coronary artery disease.   At this time the best next step will be to refer the patient for a left heart catheterization.  In the meantime he has stopped his  aspirin and statin, therefore we will restart his aspirin 81 mg daily along with the Plavix 75 mg daily.  I will restart his Crestor 20 mg daily.  The patient understands that risks include but are not limited to stroke (1 in 1000), death (1 in 47), kidney failure [usually temporary] (1 in 500), bleeding (1 in 200), allergic reaction [possibly serious] (1 in 200), and agrees to proceed.  Asked the patient not to stop any of these medications for now.  In addition given his chest pain symptoms we will add Imdur 15 mg daily.  Blood pressure acceptable in the office today no changes will be made.  The patient is in agreement with the above plan. The patient left the office in stable condition.  The patient will follow up in 2 weeks for heart catheterization.   Medication Adjustments/Labs and Tests Ordered: Current medicines are reviewed at length with the patient today.  Concerns regarding medicines are outlined above.  Orders Placed This Encounter   Procedures  . Basic metabolic panel  . CBC with Differential/Platelet  . Magnesium  . LONG TERM MONITOR-LIVE TELEMETRY (3-14 DAYS)  . ECHOCARDIOGRAM COMPLETE   Meds ordered this encounter  Medications  . isosorbide mononitrate (IMDUR) 30 MG 24 hr tablet    Sig: Take 0.5 tablets (15 mg total) by mouth daily.    Dispense:  45 tablet    Refill:  3  . aspirin EC 81 MG tablet    Sig: Take 1 tablet (81 mg total) by mouth daily. Swallow whole.    Dispense:  90 tablet    Refill:  3  . rosuvastatin (CRESTOR) 20 MG tablet    Sig: Take 1 tablet (20 mg total) by mouth daily.    Dispense:  90 tablet    Refill:  3    Patient Instructions   Medication Instructions:  Your physician has recommended you make the following change in your medication:  START: Crestor 20 mg once daily START: Imdur 15 mg once daily START: Aspirin 81 mg once daily *If you need a refill on your cardiac medications before your next appointment, please call your pharmacy*   Lab Work: Your physician recommends that you return for lab work:  TODAY: BMET, Mag, CBC If you have labs (blood work) drawn today and your tests are completely normal, you will receive your results only by: Marland Kitchen MyChart Message (if you have MyChart) OR . A paper copy in the mail If you have any lab test that is abnormal or we need to change your treatment, we will call you to review the results.   Testing/Procedures: Your physician has requested that you have an echocardiogram. Echocardiography is a painless test that uses sound waves to create images of your heart. It provides your doctor with information about the size and shape of your heart and how well your heart's chambers and valves are working. This procedure takes approximately one hour. There are no restrictions for this procedure.  A zio monitor was ordered today. It will remain on for 7 days. You will then return monitor and event diary in provided box. It takes 1-2 weeks for report  to be downloaded and returned to [place]. We will call you with the results. If monitor falls off or has orange flashing light, please call Zio for further instructions.      [address] [place] [address] Dept: [phone] Loc: [place]  01/31/2021  You are scheduled for a Cardiac Catheterization on Friday, May 13 with Dr. [name].  1. Please arrive at the [facility] (Main Entrance A) at [facility]: [address] [place], [place] [address] at 5:30 AM (This time is two hours before your procedure to ensure your preparation). Free valet parking service is available.   Special note: Every effort is made to have your procedure done on time. Please understand that emergencies sometimes delay scheduled procedures.  2. Diet: Do not eat solid foods after midnight.  The patient may have clear liquids until 5am upon the day of the procedure.  3. Labs: You will need to have blood drawn on TODAY  4. Medication instructions in preparation for your procedure:   Contrast Allergy: No   On the morning of your procedure, take your Aspirin and any morning medicines NOT listed above.  You may use sips of water.  5. Plan for one night stay--bring personal belongings. 6. Bring a current list of your medications and current insurance cards. 7. You MUST have a responsible person to drive you home. 8. Someone MUST be with you the first 24 hours after you arrive home or your discharge will be delayed. 9. Please wear clothes that are easy to get on and off and wear slip-on shoes.  Thank you for allowing [place] to care for you!   -- [facility]    Follow-Up: At [facility], you and your health needs are our priority.  As part of our continuing mission to provide you with exceptional heart care, we have created designated Provider Care Teams.   These Care Teams include your primary Cardiologist (physician) and Advanced Practice Providers (APPs -  Physician Assistants and Nurse Practitioners) who all work together to provide you with the care you need, when you need it.  We recommend signing up for the patient portal called "MyChart".  Sign up information is provided on this After Visit Summary.  MyChart is used to connect with patients for Virtual Visits (Telemedicine).  Patients are able to view lab/test results, encounter notes, upcoming appointments, etc.  Non-urgent messages can be sent to your provider as well.   To learn more about what you can do with MyChart, go to [email or web address].    Your next appointment:   2 week(s) after Cath  The format for your next appointment:   In Person  Provider:   [name], DO   Other Instructions Echocardiogram An echocardiogram is a test that uses sound waves (ultrasound) to produce images of the heart. Images from an echocardiogram can provide important information about:  Heart size and shape.  The size and thickness and movement of your heart's walls.  Heart muscle function and strength.  Heart valve function or if you have stenosis. Stenosis is when the heart valves are too narrow.  If blood is flowing backward through the heart valves (regurgitation).  A tumor or infectious growth around the heart valves.  Areas of heart muscle that are not working well because of poor blood flow or injury from a heart attack.  Aneurysm detection. An aneurysm is a weak or damaged part of an artery wall. The wall bulges out from the normal force of blood pumping through the body. Tell a health care provider about:  Any allergies you have.  All medicines you are taking, including vitamins, herbs, eye drops, creams, and over-the-counter medicines.  Any blood disorders you have.  Any surgeries  you have had.  Any medical conditions you have.  Whether you are pregnant or may be  pregnant. What are the risks? Generally, this is a safe test. However, problems may occur, including an allergic reaction to dye (contrast) that may be used during the test. What happens before the test? No specific preparation is needed. You may eat and drink normally. What happens during the test?  You will take off your clothes from the waist up and put on a hospital gown.  Electrodes or electrocardiogram (ECG)patches may be placed on your chest. The electrodes or patches are then connected to a device that monitors your heart rate and rhythm.  You will lie down on a table for an ultrasound exam. A gel will be applied to your chest to help sound waves pass through your skin.  A handheld device, called a transducer, will be pressed against your chest and moved over your heart. The transducer produces sound waves that travel to your heart and bounce back (or "echo" back) to the transducer. These sound waves will be captured in real-time and changed into images of your heart that can be viewed on a video monitor. The images will be recorded on a computer and reviewed by your health care provider.  You may be asked to change positions or hold your breath for a short time. This makes it easier to get different views or better views of your heart.  In some cases, you may receive contrast through an IV in one of your veins. This can improve the quality of the pictures from your heart. The procedure may vary among health care providers and hospitals.   What can I expect after the test? You may return to your normal, everyday life, including diet, activities, and medicines, unless your health care provider tells you not to do that. Follow these instructions at home:  It is up to you to get the results of your test. Ask your health care provider, or the department that is doing the test, when your results will be ready.  Keep all follow-up visits. This is important. Summary  An echocardiogram is  a test that uses sound waves (ultrasound) to produce images of the heart.  Images from an echocardiogram can provide important information about the size and shape of your heart, heart muscle function, heart valve function, and other possible heart problems.  You do not need to do anything to prepare before this test. You may eat and drink normally.  After the echocardiogram is completed, you may return to your normal, everyday life, unless your health care provider tells you not to do that. This information is not intended to replace advice given to you by your health care provider. Make sure you discuss any questions you have with your health care provider. Document Revised: 05/02/2020 Document Reviewed: 05/02/2020 Elsevier Patient Education  2021 [employer] DOCTOR PRESCRIBING ZIO? The Zio system is proven and trusted by physicians to detect and diagnose irregular heart rhythms -- and has been prescribed to hundreds of thousands of patients.  The FDA has cleared the Zio system to monitor for many different kinds of irregular heart rhythms. In a study, physicians were able to reach a diagnosis 90% of the time with the Zio system1.  You can wear the Zio monitor -- a small, discreet, comfortable patch -- during your normal day-to-day activity, including while you sleep, shower, and exercise, while it records every single heartbeat for analysis.  1Barrett, P., et al. Comparison of 24 Hour Holter Monitoring Versus 14 Day Novel Adhesive Patch Electrocardiographic Monitoring. [place], 2014.  ZIO VS. HOLTER MONITORING The Zio monitor can be comfortably worn for up to 7 days. Holter monitors can be worn for 24 to 48 hours, limiting the time to record any irregular heart rhythms you may have. Zio is able to capture data for the 51% of patients who have their first symptom-triggered arrhythmia after 48 hours.1  LIVE WITHOUT RESTRICTIONS The Zio ambulatory  cardiac monitor is a small, unobtrusive, and water-resistant patch--you might even forget you're wearing it. The Zio monitor records and stores every beat of your heart, whether you're sleeping, working out, or showering.      Adopting a Healthy Lifestyle.  Know what a healthy weight is for you (roughly BMI <25) and aim to maintain this   Aim for 7+ servings of fruits and vegetables daily   65-80+ fluid ounces of water or unsweet tea for healthy kidneys   Limit to max 1 drink of alcohol per day; avoid smoking/tobacco   Limit animal fats in diet for cholesterol and heart health - choose grass fed whenever available   Avoid highly processed foods, and foods high in saturated/trans fats   Aim for low stress - take time to unwind and care for your mental health   Aim for 150 min of moderate intensity exercise weekly for heart health, and weights twice weekly for bone health   Aim for 7-9 hours of sleep daily   When it comes to diets, agreement about the perfect plan isnt easy to find, even among the experts. Experts at the [employer] developed an idea known as the Healthy Eating Plate. Just imagine a plate divided into logical, healthy portions.   The emphasis is on diet quality:   Load up on vegetables and fruits - one-half of your plate: Aim for color and variety, and remember that potatoes dont count.   Go for whole grains - one-quarter of your plate: Whole wheat, barley, wheat berries, quinoa, oats, brown rice, and foods made with them. If you want pasta, go with whole wheat pasta.   Protein power - one-quarter of your plate: Fish, chicken, beans, and nuts are all healthy, versatile protein sources. Limit red meat.   The diet, however, does go beyond the plate, offering a few other suggestions.   Use healthy plant oils, such as olive, canola, soy, corn, sunflower and peanut. Check the labels, and avoid partially hydrogenated oil, which have unhealthy trans  fats.   If youre thirsty, drink water. Coffee and tea are good in moderation, but skip sugary drinks and limit milk and dairy products to one or two daily servings.   The type of carbohydrate in the diet is more important than the amount. Some sources of carbohydrates, such as vegetables, fruits, whole grains, and beans-are healthier than others.   Finally, stay active  Signed, [name], DO  01/31/2021 1:07 PM    [facility]

## 2021-02-01 ENCOUNTER — Telehealth: Payer: Self-pay | Admitting: *Deleted

## 2021-02-01 ENCOUNTER — Other Ambulatory Visit: Payer: Self-pay

## 2021-02-01 DIAGNOSIS — Z1152 Encounter for screening for COVID-19: Secondary | ICD-10-CM

## 2021-02-01 LAB — BASIC METABOLIC PANEL
BUN/Creatinine Ratio: 14 (ref 9–20)
BUN: 14 mg/dL (ref 6–24)
CO2: 24 mmol/L (ref 20–29)
Calcium: 9.8 mg/dL (ref 8.7–10.2)
Chloride: 99 mmol/L (ref 96–106)
Creatinine, Ser: 1.02 mg/dL (ref 0.76–1.27)
Glucose: 109 mg/dL — ABNORMAL HIGH (ref 65–99)
Potassium: 4.7 mmol/L (ref 3.5–5.2)
Sodium: 139 mmol/L (ref 134–144)
eGFR: 89 mL/min/{1.73_m2} (ref >59)

## 2021-02-01 LAB — CBC WITH DIFFERENTIAL/PLATELET
Basophils Absolute: 0.1 10*3/uL (ref 0.0–0.2)
Basos: 1 %
EOS (ABSOLUTE): 0.1 10*3/uL (ref 0.0–0.4)
Eos: 2 %
Hematocrit: 48.5 % (ref 37.5–51.0)
Hemoglobin: 16.7 g/dL (ref 13.0–17.7)
Immature Grans (Abs): 0 10*3/uL (ref 0.0–0.1)
Immature Granulocytes: 0 %
Lymphocytes Absolute: 1.9 10*3/uL (ref 0.7–3.1)
Lymphs: 30 %
MCH: 31.3 pg (ref 26.6–33.0)
MCHC: 34.4 g/dL (ref 31.5–35.7)
MCV: 91 fL (ref 79–97)
Monocytes Absolute: 0.5 10*3/uL (ref 0.1–0.9)
Monocytes: 8 %
Neutrophils Absolute: 3.8 10*3/uL (ref 1.4–7.0)
Neutrophils: 59 %
Platelets: 199 10*3/uL (ref 150–450)
RBC: 5.34 x10E6/uL (ref 4.14–5.80)
RDW: 11.8 % (ref 11.6–15.4)
WBC: 6.5 10*3/uL (ref 3.4–10.8)

## 2021-02-01 LAB — MAGNESIUM: Magnesium: 1.9 mg/dL (ref 1.6–2.3)

## 2021-02-01 NOTE — Telephone Encounter (Addendum)
Pt contacted pre-catheterization scheduled at [facility] for: Friday Feb 02, 2021 7:30 AM Verified arrival time and place: [facility]) at: 5:30 AM   No solid food after midnight prior to cath, clear liquids until 5 AM day of procedure.   AM meds can be  taken pre-cath with sips of water including: ASA 81 mg Plavix 75 mg  Confirmed patient has responsible adult to drive home post procedure and be with patient first 24 hours after arriving home: yes  You are allowed ONE visitor in the waiting room during the time you are at the hospital for your procedure. Both you and your visitor must wear a mask once you enter the hospital.   Reviewed procedure/mask/visitor instructions with patient.     Pt reports he was COVID positive 01/17/21 at [facility], pt denies any symptoms concerning for COVID-19.               Copy of 01/17/21 COVID positive result scanned under the Lab tab in Epic.

## 2021-02-01 NOTE — Addendum Note (Signed)
Addended by: Birder Robson on: 02/01/2021 03:50 PM   Modules accepted: Orders

## 2021-02-02 ENCOUNTER — Ambulatory Visit (HOSPITAL_COMMUNITY)
Admission: RE | Admit: 2021-02-02 | Discharge: 2021-02-02 | Disposition: A | Discharge status: Home / Self Care | Payer: Medicare HMO | Attending: Cardiology | Admitting: Cardiology

## 2021-02-02 ENCOUNTER — Other Ambulatory Visit: Payer: Self-pay

## 2021-02-02 ENCOUNTER — Encounter (HOSPITAL_COMMUNITY): Payer: Self-pay | Admitting: Cardiology

## 2021-02-02 ENCOUNTER — Encounter (HOSPITAL_COMMUNITY)
Admission: RE | Disposition: A | Discharge status: Home / Self Care | Payer: Self-pay | Source: Home / Self Care | Attending: Cardiology

## 2021-02-02 DIAGNOSIS — Z888 Allergy status to other drugs, medicaments and biological substances status: Secondary | ICD-10-CM | POA: Diagnosis not present

## 2021-02-02 DIAGNOSIS — R9439 Abnormal result of other cardiovascular function study: Secondary | ICD-10-CM | POA: Diagnosis not present

## 2021-02-02 DIAGNOSIS — I10 Essential (primary) hypertension: Secondary | ICD-10-CM | POA: Insufficient documentation

## 2021-02-02 DIAGNOSIS — R931 Abnormal findings on diagnostic imaging of heart and coronary circulation: Secondary | ICD-10-CM | POA: Insufficient documentation

## 2021-02-02 DIAGNOSIS — Z79899 Other long term (current) drug therapy: Secondary | ICD-10-CM | POA: Diagnosis not present

## 2021-02-02 DIAGNOSIS — R079 Chest pain, unspecified: Secondary | ICD-10-CM | POA: Diagnosis not present

## 2021-02-02 DIAGNOSIS — E782 Mixed hyperlipidemia: Secondary | ICD-10-CM | POA: Diagnosis not present

## 2021-02-02 DIAGNOSIS — Z7982 Long term (current) use of aspirin: Secondary | ICD-10-CM | POA: Insufficient documentation

## 2021-02-02 DIAGNOSIS — Z7902 Long term (current) use of antithrombotics/antiplatelets: Secondary | ICD-10-CM | POA: Insufficient documentation

## 2021-02-02 DIAGNOSIS — Z87891 Personal history of nicotine dependence: Secondary | ICD-10-CM | POA: Diagnosis not present

## 2021-02-02 DIAGNOSIS — E669 Obesity, unspecified: Secondary | ICD-10-CM | POA: Diagnosis present

## 2021-02-02 HISTORY — PX: LEFT HEART CATH AND CORONARY ANGIOGRAPHY: CATH118249

## 2021-02-02 SURGERY — LEFT HEART CATH AND CORONARY ANGIOGRAPHY
Anesthesia: LOCAL

## 2021-02-02 MED ORDER — SODIUM CHLORIDE 0.9 % IV SOLN: 250.0000 mL | INTRAVENOUS | Status: DC | PRN

## 2021-02-02 MED ORDER — HEPARIN SODIUM (PORCINE) 1000 UNIT/ML IJ SOLN
INTRAMUSCULAR | Status: AC
  Filled 2021-02-02: qty 1

## 2021-02-02 MED ORDER — MIDAZOLAM HCL 2 MG/2ML IJ SOLN
INTRAMUSCULAR | Status: DC | PRN
  Administered 2021-02-02: 1 mg via INTRAVENOUS

## 2021-02-02 MED ORDER — LIDOCAINE HCL (PF) 1 % IJ SOLN
INTRAMUSCULAR | Status: AC
  Filled 2021-02-02: qty 30

## 2021-02-02 MED ORDER — SODIUM CHLORIDE 0.9 % WEIGHT BASED INFUSION: 1.0000 mL/kg/h | INTRAVENOUS | Status: DC

## 2021-02-02 MED ORDER — ASPIRIN 81 MG PO CHEW
CHEWABLE_TABLET | ORAL | Status: AC
  Administered 2021-02-02: 81 mg via ORAL
  Filled 2021-02-02: qty 1

## 2021-02-02 MED ORDER — FENTANYL CITRATE (PF) 100 MCG/2ML IJ SOLN
INTRAMUSCULAR | Status: AC
  Filled 2021-02-02: qty 2

## 2021-02-02 MED ORDER — FENTANYL CITRATE (PF) 100 MCG/2ML IJ SOLN
INTRAMUSCULAR | Status: DC | PRN
  Administered 2021-02-02: 25 ug via INTRAVENOUS

## 2021-02-02 MED ORDER — SODIUM CHLORIDE 0.9% FLUSH: 3.0000 mL | INTRAVENOUS | Status: DC | PRN

## 2021-02-02 MED ORDER — ACETAMINOPHEN 325 MG PO TABS: 650.0000 mg | ORAL_TABLET | ORAL | Status: DC | PRN

## 2021-02-02 MED ORDER — HEPARIN SODIUM (PORCINE) 1000 UNIT/ML IJ SOLN
INTRAMUSCULAR | Status: DC | PRN
  Administered 2021-02-02: 5500 [IU] via INTRAVENOUS

## 2021-02-02 MED ORDER — SODIUM CHLORIDE 0.9% FLUSH: 3.0000 mL | Freq: Two times a day (BID) | INTRAVENOUS | Status: DC

## 2021-02-02 MED ORDER — HEPARIN (PORCINE) IN NACL 1000-0.9 UT/500ML-% IV SOLN
INTRAVENOUS | Status: AC
  Filled 2021-02-02: qty 1500

## 2021-02-02 MED ORDER — MIDAZOLAM HCL 2 MG/2ML IJ SOLN
INTRAMUSCULAR | Status: AC
  Filled 2021-02-02: qty 2

## 2021-02-02 MED ORDER — HEPARIN (PORCINE) IN NACL 1000-0.9 UT/500ML-% IV SOLN
INTRAVENOUS | Status: DC | PRN
  Administered 2021-02-02 (×2): 500 mL

## 2021-02-02 MED ORDER — SODIUM CHLORIDE 0.9 % WEIGHT BASED INFUSION: 1.0000 mL/kg/h | INTRAVENOUS | Status: AC

## 2021-02-02 MED ORDER — LIDOCAINE HCL (PF) 1 % IJ SOLN
INTRAMUSCULAR | Status: DC | PRN
  Administered 2021-02-02: 2 mL

## 2021-02-02 MED ORDER — VERAPAMIL HCL 2.5 MG/ML IV SOLN
INTRAVENOUS | Status: DC | PRN
  Administered 2021-02-02: 10 mL via INTRA_ARTERIAL

## 2021-02-02 MED ORDER — IOHEXOL 350 MG/ML SOLN
INTRAVENOUS | Status: DC | PRN
  Administered 2021-02-02: 60 mL via INTRA_ARTERIAL

## 2021-02-02 MED ORDER — ONDANSETRON HCL 4 MG/2ML IJ SOLN: 4.0000 mg | Freq: Four times a day (QID) | INTRAMUSCULAR | Status: DC | PRN

## 2021-02-02 MED ORDER — SODIUM CHLORIDE 0.9 % WEIGHT BASED INFUSION
3.0000 mL/kg/h | INTRAVENOUS | Status: AC
  Administered 2021-02-02: 3 mL/kg/h via INTRAVENOUS

## 2021-02-02 MED ORDER — ASPIRIN 81 MG PO CHEW: 81.0000 mg | CHEWABLE_TABLET | ORAL | Status: AC

## 2021-02-02 MED ORDER — VERAPAMIL HCL 2.5 MG/ML IV SOLN
INTRAVENOUS | Status: AC
  Filled 2021-02-02: qty 2

## 2021-02-02 SURGICAL SUPPLY — 9 items
CATH 5FR JL3.5 JR4 ANG PIG MP (CATHETERS) ×2 IMPLANT
DEVICE RAD COMP TR BAND LRG (VASCULAR PRODUCTS) ×2 IMPLANT
GLIDESHEATH SLEND SS 6F .021 (SHEATH) ×2 IMPLANT
GUIDEWIRE INQWIRE 1.5J.035X260 (WIRE) ×1 IMPLANT
INQWIRE 1.5J .035X260CM (WIRE) ×2
KIT HEART LEFT (KITS) ×2 IMPLANT
PACK CARDIAC CATHETERIZATION (CUSTOM PROCEDURE TRAY) ×2 IMPLANT
TRANSDUCER W/STOPCOCK (MISCELLANEOUS) ×2 IMPLANT
TUBING CIL FLEX 10 FLL-RA (TUBING) ×2 IMPLANT

## 2021-02-02 NOTE — Interval H&P Note (Signed)
History and Physical Interval Note:  02/02/2021 7:28 AM  Ian Mathis  has presented today for surgery, with the diagnosis of cp  abn stress.  The various methods of treatment have been discussed with the patient and family. After consideration of risks, benefits and other options for treatment, the patient has consented to  Procedure(s): LEFT HEART CATH AND CORONARY ANGIOGRAPHY (N/A) as a surgical intervention.  The patient's history has been reviewed, patient examined, no change in status, stable for surgery.  I have reviewed the patient's chart and labs.  Questions were answered to the patient's satisfaction.   Cath Lab Visit (complete for each Cath Lab visit)  Clinical Evaluation Leading to the Procedure:   ACS: No.  Non-ACS:    Anginal Classification: CCS II  Anti-ischemic medical therapy: Maximal Therapy (2 or more classes of medications)  Non-Invasive Test Results: Low-risk stress test findings: cardiac mortality <1%/year  Prior CABG: No previous CABG        [name] [facility] 02/02/2021 7:28 AM

## 2021-02-02 NOTE — Discharge Instructions (Signed)
Radial Site Care  This sheet gives you information about how to care for yourself after your procedure. Your health care provider may also give you more specific instructions. If you have problems or questions, contact your health care provider. What can I expect after the procedure? After the procedure, it is common to have:  Bruising and tenderness at the catheter insertion area. Follow these instructions at home: Medicines  Take over-the-counter and prescription medicines only as told by your health care provider. Insertion site care  Follow instructions from your health care provider about how to take care of your insertion site. Make sure you: ? Wash your hands with soap and water before you change your bandage (dressing). If soap and water are not available, use hand sanitizer. ? Change your dressing as told by your health care provider. ? Leave stitches (sutures), skin glue, or adhesive strips in place. These skin closures may need to stay in place for 2 weeks or longer. If adhesive strip edges start to loosen and curl up, you may trim the loose edges. Do not remove adhesive strips completely unless your health care provider tells you to do that.  Check your insertion site every day for signs of infection. Check for: ? Redness, swelling, or pain. ? Fluid or blood. ? Pus or a bad smell. ? Warmth.  Do not take baths, swim, or use a hot tub until your health care provider approves.  You may shower 24-48 hours after the procedure, or as directed by your health care provider. ? Remove the dressing and gently wash the site with plain soap and water. ? Pat the area dry with a clean towel. ? Do not rub the site. That could cause bleeding.  Do not apply powder or lotion to the site. Activity  For 24 hours after the procedure, or as directed by your health care provider: ? Do not flex or bend the affected arm. ? Do not push or pull heavy objects with the affected arm. ? Do not drive  yourself home from the hospital or clinic. You may drive 24 hours after the procedure unless your health care provider tells you not to. ? Do not operate machinery or power tools.  Do not lift anything that is heavier than 10 lb (4.5 kg), or the limit that you are told, until your health care provider says that it is safe.  Ask your health care provider when it is okay to: ? Return to work or school. ? Resume usual physical activities or sports. ? Resume sexual activity.   General instructions  If the catheter site starts to bleed, raise your arm and put firm pressure on the site. If the bleeding does not stop, get help right away. This is a medical emergency.  If you went home on the same day as your procedure, a responsible adult should be with you for the first 24 hours after you arrive home.  Keep all follow-up visits as told by your health care provider. This is important. Contact a health care provider if:  You have a fever.  You have redness, swelling, or yellow drainage around your insertion site. Get help right away if:  You have unusual pain at the radial site.  The catheter insertion area swells very fast.  The insertion area is bleeding, and the bleeding does not stop when you hold steady pressure on the area.  Your arm or hand becomes pale, cool, tingly, or numb. These symptoms may represent a serious   problem that is an emergency. Do not wait to see if the symptoms will go away. Get medical help right away. Call your local emergency services (911 in the [place] Do not drive yourself to the hospital. Summary  After the procedure, it is common to have bruising and tenderness at the site.  Follow instructions from your health care provider about how to take care of your radial site wound. Check the wound every day for signs of infection.  Do not lift anything that is heavier than 10 lb (4.5 kg), or the limit that you are told, until your health care provider says that it  is safe. This information is not intended to replace advice given to you by your health care provider. Make sure you discuss any questions you have with your health care provider. Document Revised: 10/15/2017 Document Reviewed: 10/15/2017 Elsevier Patient Education  2021 Elsevier Inc.  

## 2021-02-03 DIAGNOSIS — R079 Chest pain, unspecified: Secondary | ICD-10-CM | POA: Diagnosis not present

## 2021-02-03 DIAGNOSIS — R931 Abnormal findings on diagnostic imaging of heart and coronary circulation: Secondary | ICD-10-CM

## 2021-02-05 ENCOUNTER — Ambulatory Visit (INDEPENDENT_AMBULATORY_CARE_PROVIDER_SITE_OTHER): Payer: Medicare HMO

## 2021-02-05 ENCOUNTER — Other Ambulatory Visit: Payer: Self-pay

## 2021-02-05 DIAGNOSIS — R9439 Abnormal result of other cardiovascular function study: Secondary | ICD-10-CM

## 2021-02-05 DIAGNOSIS — R079 Chest pain, unspecified: Secondary | ICD-10-CM

## 2021-02-05 LAB — ECHOCARDIOGRAM COMPLETE
Area-P 1/2: 2.83 cm2
Calc EF: 49.3 %
S' Lateral: 2.3 cm
Single Plane A2C EF: 42.7 %
Single Plane A4C EF: 53.3 %

## 2021-02-05 NOTE — Progress Notes (Signed)
Complete echocardiogram performed.  Ian Mathis RDCS, RVT  

## 2021-02-13 ENCOUNTER — Telehealth: Payer: Self-pay | Admitting: Cardiology

## 2021-02-13 NOTE — Telephone Encounter (Signed)
Pt c/o medication issue:  1. Name of Medication: rosuvastatin (CRESTOR) 20 MG tablet  2. How are you currently taking this medication (dosage and times per day)? N/A  3. Are you having a reaction (difficulty breathing--STAT)? N/A  4. What is your medication issue? Sherri is calling to report that the patient is now eligible for a 100 day supply of this medication when his next renewal is due. She states it would be the same copay cost. A callback to [facility] is not necessary. Please advise.

## 2021-02-14 ENCOUNTER — Ambulatory Visit: Payer: Medicare HMO | Admitting: Cardiology

## 2021-02-14 ENCOUNTER — Encounter: Payer: Self-pay | Admitting: Cardiology

## 2021-02-14 ENCOUNTER — Other Ambulatory Visit: Payer: Self-pay

## 2021-02-14 VITALS — BP 116/88 | HR 86 | Ht 72.0 in | Wt 240.0 lb

## 2021-02-14 DIAGNOSIS — E669 Obesity, unspecified: Secondary | ICD-10-CM

## 2021-02-14 DIAGNOSIS — I10 Essential (primary) hypertension: Secondary | ICD-10-CM | POA: Diagnosis not present

## 2021-02-14 DIAGNOSIS — R55 Syncope and collapse: Secondary | ICD-10-CM | POA: Diagnosis not present

## 2021-02-14 DIAGNOSIS — R0602 Shortness of breath: Secondary | ICD-10-CM

## 2021-02-14 MED ORDER — METOPROLOL TARTRATE 25 MG PO TABS: 12.5000 mg | ORAL_TABLET | Freq: Two times a day (BID) | ORAL | 3 refills | Status: AC

## 2021-02-14 NOTE — Patient Instructions (Signed)
Medication Instructions:  Your physician has recommended you make the following change in your medication: STOP:  Plavix STOP: Imdur STOP: Aspirin DECREASE: Lopressor 12.5 mg twice daily *If you need a refill on your cardiac medications before your next appointment, please call your pharmacy*   Lab Work: None If you have labs (blood work) drawn today and your tests are completely normal, you will receive your results only by: Marland Kitchen MyChart Message (if you have MyChart) OR . A paper copy in the mail If you have any lab test that is abnormal or we need to change your treatment, we will call you to review the results.   Testing/Procedures: None   Follow-Up: At [facility], you and your health needs are our priority.  As part of our continuing mission to provide you with exceptional heart care, we have created designated Provider Care Teams.  These Care Teams include your primary Cardiologist (physician) and Advanced Practice Providers (APPs -  Physician Assistants and Nurse Practitioners) who all work together to provide you with the care you need, when you need it.  We recommend signing up for the patient portal called "MyChart".  Sign up information is provided on this After Visit Summary.  MyChart is used to connect with patients for Virtual Visits (Telemedicine).  Patients are able to view lab/test results, encounter notes, upcoming appointments, etc.  Non-urgent messages can be sent to your provider as well.   To learn more about what you can do with MyChart, go to [email or web address].    Your next appointment:   1 year(s)  The format for your next appointment:   In Person  Provider:   [name], DO   Other Instructions

## 2021-02-14 NOTE — Progress Notes (Signed)
Cardiology Office Note:    Date:  02/14/2021   ID:  Ian Mathis, DOB 1971-02-23, MRN [id number]  PCP:  Rubie Maid, MD  Cardiologist:  [name], DO  Electrophysiologist:  None   Referring MD: Rubie Maid, MD   I am so short of breath  History of Present Illness:    Ian Mathis is a 50 y.o. male with a hx of hypertension, former smoker who recently was hospitalized at [facility] after he presented with a syncope episode.  His hospitalization he had a positive stress test and due to a positive COVID 19 testing he was discharged home for cardiology follow-up.  When I saw the patient on Jan 31, 2021 he told me he had been experiencing intermittent chest discomfort.  I did refer the patient for a heart catheterization.  He underwent his heart catheterization on Feb 03, 2019 which only showed very minimal luminal irregularities.  He tells me that he has been experiencing shortness of breath.  He has had no chest discomfort.  Past Medical History:  Diagnosis Date  . Abnormal stress test 01/31/2021  . Chest pain of uncertain etiology 12/14/4008  . Chronic nonalcoholic liver disease 2/72/5366  . Chronic pain syndrome 10/04/2013  . DDD (degenerative disc disease), lumbosacral 05/14/2013  . Decreased muscle tone 03/31/2014  . Dysthymic disorder 06/16/2013  . Encounter for therapeutic drug level monitoring 09/28/2014  . Essential tremor 01/15/2016  . Generalized anxiety disorder 05/14/2013  . Hypertension 03/07/2017  . Low back pain 09/03/2013  . Mixed emotional features as adjustment reaction 06/16/2013  . Mixed hyperlipidemia 01/31/2021  . Multiple dysplastic nevi 08/08/2017  . Muscular deconditioning 03/31/2014  . Obesity (BMI 30-39.9) 01/31/2021  . 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 Pain in soft tissues of limb 09/03/2013  . Pain medication agreement 07/26/2014  . Pain of right hip joint 03/31/2014  . Postoperative heterotopic calcification  05/14/2013  . Psychogenic pain 09/10/2013  . S/P insertion of spinal cord stimulator 09/22/2017  . Surgery follow-up examination 09/10/2013    Past Surgical History:  Procedure Laterality Date  . LEFT HEART CATH AND CORONARY ANGIOGRAPHY N/A 02/02/2021   Procedure: LEFT HEART CATH AND CORONARY ANGIOGRAPHY;  Surgeon: Martinique, Peter M, MD;  Location: [facility];  Service: Cardiovascular;  Laterality: N/A;    Current Medications: Current Meds  Medication Sig  . baclofen (LIORESAL) 10 MG tablet Take 10 mg by mouth 3 (three) times daily.  Marland Kitchen FLUoxetine (PROZAC) 20 MG capsule Take 20 mg by mouth every morning.  Marland Kitchen HYDROcodone-acetaminophen (NORCO) 10-325 MG tablet Take 1 tablet by mouth every 8 (eight) hours as needed for moderate pain or severe pain.  . meloxicam (MOBIC) 15 MG tablet Take 15 mg by mouth daily.  . metoprolol tartrate (LOPRESSOR) 25 MG tablet Take 0.5 tablets (12.5 mg total) by mouth 2 (two) times daily.  Marland Kitchen morphine (MS CONTIN) 30 MG 12 hr tablet Take 30 mg by mouth 2 (two) times daily as needed for pain.  . nitroGLYCERIN (NITROSTAT) 0.3 MG SL tablet Place 1 tablet under the tongue as needed for chest pain.  Marland Kitchen OLANZapine (ZYPREXA) 5 MG tablet Take 5 mg by mouth at bedtime.  . traZODone (DESYREL) 150 MG tablet Take 300 mg by mouth at bedtime.  . [DISCONTINUED] aspirin EC 81 MG tablet Take 1 tablet (81 mg total) by mouth daily. Swallow whole.  . [DISCONTINUED] clopidogrel (PLAVIX) 75 MG tablet Take 75 mg by mouth daily.  . [  DISCONTINUED] isosorbide mononitrate (IMDUR) 30 MG 24 hr tablet Take 0.5 tablets (15 mg total) by mouth daily.  . [DISCONTINUED] metoprolol tartrate (LOPRESSOR) 25 MG tablet Take 25 mg by mouth 2 (two) times daily.     Allergies:   Atorvastatin, Crestor [rosuvastatin], and Zolpidem   Social History   Socioeconomic History  . Marital status: Significant Other    Spouse name: Not on file  . Number of children: Not on file  . Years of education: Not on  file  . Highest education level: Not on file  Occupational History  . Not on file  Tobacco Use  . Smoking status: Not on file  . Smokeless tobacco: Not on file  Substance and Sexual Activity  . Alcohol use: Not on file  . Drug use: Not on file  . Sexual activity: Not on file  Other Topics Concern  . Not on file  Social History Narrative  . Not on file   Social Determinants of Health   Financial Resource Strain: Not on file  Food Insecurity: Not on file  Transportation Needs: Not on file  Physical Activity: Not on file  Stress: Not on file  Social Connections: Not on file     Family History: The patient's family history is not on file.  ROS:   Review of Systems  Constitution: Negative for decreased appetite, fever and weight gain.  HENT: Negative for congestion, ear discharge, hoarse voice and sore throat.   Eyes: Negative for discharge, redness, vision loss in right eye and visual halos.  Cardiovascular: Negative for chest pain, dyspnea on exertion, leg swelling, orthopnea and palpitations.  Respiratory: Negative for cough, hemoptysis, shortness of breath and snoring.   Endocrine: Negative for heat intolerance and polyphagia.  Hematologic/Lymphatic: Negative for bleeding problem. Does not bruise/bleed easily.  Skin: Negative for flushing, nail changes, rash and suspicious lesions.  Musculoskeletal: Negative for arthritis, joint pain, muscle cramps, myalgias, neck pain and stiffness.  Gastrointestinal: Negative for abdominal pain, bowel incontinence, diarrhea and excessive appetite.  Genitourinary: Negative for decreased libido, genital sores and incomplete emptying.  Neurological: Negative for brief paralysis, focal weakness, headaches and loss of balance.  Psychiatric/Behavioral: Negative for altered mental status, depression and suicidal ideas.  Allergic/Immunologic: Negative for HIV exposure and persistent infections.    EKGs/Labs/Other Studies Reviewed:    The  following studies were reviewed today:   EKG: None today   Transthoracic echocardiogram Feb 05, 2021 IMPRESSIONS  1. Left ventricular ejection fraction, by estimation, is 60 to 65%. The  left ventricle has normal function. The left ventricle has no regional  wall motion abnormalities.  2. Right ventricular systolic function is normal. The right ventricular  size is normal. There is normal pulmonary artery systolic pressure.  3. The mitral valve is normal in structure. No evidence of mitral valve  regurgitation. No evidence of mitral stenosis.  4. The aortic valve is normal in structure. Aortic valve regurgitation is  not visualized. No aortic stenosis is present.  5. The inferior vena cava is normal in size with greater than 50%  respiratory variability, suggesting right atrial pressure of 3 mmHg.   FINDINGS  Left Ventricle: Left ventricular ejection fraction, by estimation, is 60  to 65%. The left ventricle has normal function. The left ventricle has no  regional wall motion abnormalities. The left ventricular internal cavity  size was normal in size. There is  no left ventricular hypertrophy. Left ventricular diastolic parameters  were normal.   Right Ventricle: The right  ventricular size is normal. No increase in  right ventricular wall thickness. Right ventricular systolic function is  normal. There is normal pulmonary artery systolic pressure. The tricuspid  regurgitant velocity is 1.76 m/s, and  with an assumed right atrial pressure of 8 mmHg, the estimated right  ventricular systolic pressure is 52.8 mmHg.   Left Atrium: Left atrial size was normal in size.   Right Atrium: Right atrial size was normal in size.   Pericardium: There is no evidence of pericardial effusion.   Mitral Valve: The mitral valve is normal in structure. No evidence of  mitral valve regurgitation. No evidence of mitral valve stenosis.   Tricuspid Valve: The tricuspid valve is normal in  structure. Tricuspid valve regurgitation is not demonstrated. No evidence of tricuspid  stenosis.   Aortic Valve: The aortic valve is normal in structure. Aortic valve  regurgitation is not visualized. No aortic stenosis is present.   Pulmonic Valve: The pulmonic valve was normal in structure. Pulmonic valve regurgitation is not visualized. No evidence of pulmonic stenosis.   Aorta: The aortic root is normal in size and structure.   Venous: The inferior vena cava is normal in size with greater than 50% respiratory variability, suggesting right atrial pressure of 3 mmHg.   IAS/Shunts: No atrial level shunt detected by color flow Doppler.  Recent Labs: 01/31/2021: BUN 14; Creatinine, Ser 1.02; Hemoglobin 16.7; Magnesium 1.9; Platelets 199; Potassium 4.7; Sodium 139  Recent Lipid Panel No results found for: CHOL, TRIG, HDL, CHOLHDL, VLDL, LDLCALC, LDLDIRECT  Physical Exam:    VS:  BP 116/88   Pulse 86   Ht 6' (1.829 m)   Wt 240 lb (108.9 kg)   SpO2 97%   BMI 32.55 kg/m     Wt Readings from Last 3 Encounters:  02/14/21 240 lb (108.9 kg)  02/02/21 245 lb (111.1 kg)  01/31/21 245 lb 3.2 oz (111.2 kg)     GEN: Well nourished, well developed in no acute distress HEENT: Normal NECK: No JVD; No carotid bruits LYMPHATICS: No lymphadenopathy CARDIAC: S1S2 noted,RRR, no murmurs, rubs, gallops RESPIRATORY:  Clear to auscultation without rales, wheezing or rhonchi  ABDOMEN: Soft, non-tender, non-distended, +bowel sounds, no guarding. EXTREMITIES: No edema, No cyanosis, no clubbing MUSCULOSKELETAL:  No deformity  SKIN: Warm and dry NEUROLOGIC:  Alert and oriented x 3, non-focal PSYCHIATRIC:  Normal affect, good insight  ASSESSMENT:    1. SOB (shortness of breath)   2. Hypertension, unspecified type   3. Obesity (BMI 30-39.9)   4. Syncope and collapse    PLAN:     1.  We discussed his left heart catheterization report.  At this time we will stop the Plavix, aspirin and Imdur.   We will cut back on his Lopressor to 12.5 mg twice a day.  The patient stopped taking Crestor because he had significant reaction and does not want to take any statin at this time.  He still is short of breath and he has a history of smoking is appropriate this time to refer the patient to pulmonary for a formal PFT as well as evaluation for potential obstructive disease.  His ZIO monitor results is pending.  The patient understands the need to lose weight with diet and exercise. We have discussed specific strategies for this.  The patient is in agreement with the above plan. The patient left the office in stable condition.  The patient will follow up in   Medication Adjustments/Labs and Tests Ordered: Current medicines are  reviewed at length with the patient today.  Concerns regarding medicines are outlined above.  Orders Placed This Encounter  Procedures  . Ambulatory referral to Pulmonology   Meds ordered this encounter  Medications  . metoprolol tartrate (LOPRESSOR) 25 MG tablet    Sig: Take 0.5 tablets (12.5 mg total) by mouth 2 (two) times daily.    Dispense:  90 tablet    Refill:  3    Patient Instructions  Medication Instructions:  Your physician has recommended you make the following change in your medication: STOP:  Plavix STOP: Imdur STOP: Aspirin DECREASE: Lopressor 12.5 mg twice daily *If you need a refill on your cardiac medications before your next appointment, please call your pharmacy*   Lab Work: None If you have labs (blood work) drawn today and your tests are completely normal, you will receive your results only by: Marland Kitchen MyChart Message (if you have MyChart) OR . A paper copy in the mail If you have any lab test that is abnormal or we need to change your treatment, we will call you to review the results.   Testing/Procedures: None   Follow-Up: At [facility], you and your health needs are our priority.  As part of our continuing mission to provide  you with exceptional heart care, we have created designated Provider Care Teams.  These Care Teams include your primary Cardiologist (physician) and Advanced Practice Providers (APPs -  Physician Assistants and Nurse Practitioners) who all work together to provide you with the care you need, when you need it.  We recommend signing up for the patient portal called "MyChart".  Sign up information is provided on this After Visit Summary.  MyChart is used to connect with patients for Virtual Visits (Telemedicine).  Patients are able to view lab/test results, encounter notes, upcoming appointments, etc.  Non-urgent messages can be sent to your provider as well.   To learn more about what you can do with MyChart, go to [email or web address].    Your next appointment:   1 year(s)  The format for your next appointment:   In Person  Provider:   [name], DO   Other Instructions      Adopting a Healthy Lifestyle.  Know what a healthy weight is for you (roughly BMI <25) and aim to maintain this   Aim for 7+ servings of fruits and vegetables daily   65-80+ fluid ounces of water or unsweet tea for healthy kidneys   Limit to max 1 drink of alcohol per day; avoid smoking/tobacco   Limit animal fats in diet for cholesterol and heart health - choose grass fed whenever available   Avoid highly processed foods, and foods high in saturated/trans fats   Aim for low stress - take time to unwind and care for your mental health   Aim for 150 min of moderate intensity exercise weekly for heart health, and weights twice weekly for bone health   Aim for 7-9 hours of sleep daily   When it comes to diets, agreement about the perfect plan isnt easy to find, even among the experts. Experts at the [employer] developed an idea known as the Healthy Eating Plate. Just imagine a plate divided into logical, healthy portions.   The emphasis is on diet quality:   Load up on  vegetables and fruits - one-half of your plate: Aim for color and variety, and remember that potatoes dont count.   Go for whole grains - one-quarter of your plate:  Whole wheat, barley, wheat berries, quinoa, oats, brown rice, and foods made with them. If you want pasta, go with whole wheat pasta.   Protein power - one-quarter of your plate: Fish, chicken, beans, and nuts are all healthy, versatile protein sources. Limit red meat.   The diet, however, does go beyond the plate, offering a few other suggestions.   Use healthy plant oils, such as olive, canola, soy, corn, sunflower and peanut. Check the labels, and avoid partially hydrogenated oil, which have unhealthy trans fats.   If youre thirsty, drink water. Coffee and tea are good in moderation, but skip sugary drinks and limit milk and dairy products to one or two daily servings.   The type of carbohydrate in the diet is more important than the amount. Some sources of carbohydrates, such as vegetables, fruits, whole grains, and beans-are healthier than others.   Finally, stay active  Signed, [name], DO  02/14/2021 5:32 PM    [facility]

## 2021-03-02 ENCOUNTER — Telehealth: Payer: Self-pay

## 2021-03-02 NOTE — Telephone Encounter (Signed)
-----   Message from Richardo Priest, MD sent at 03/02/2021 12:22 PM EDT ----- Normal or stable result  Good results in particular no problems with slow heart rate.

## 2021-03-02 NOTE — Telephone Encounter (Signed)
Spoke with patient regarding results and recommendation.  Patient verbalizes understanding and is agreeable to plan of care. Advised patient to call back with any issues or concerns.  

## 2021-03-19 ENCOUNTER — Ambulatory Visit: Payer: Medicare HMO | Admitting: Pulmonary Disease

## 2021-03-19 ENCOUNTER — Other Ambulatory Visit: Payer: Self-pay

## 2021-03-19 ENCOUNTER — Encounter: Payer: Self-pay | Admitting: Pulmonary Disease

## 2021-03-19 VITALS — BP 110/80 | HR 75 | Temp 98.0°F | Ht 72.0 in | Wt 244.2 lb

## 2021-03-19 DIAGNOSIS — R0602 Shortness of breath: Secondary | ICD-10-CM | POA: Diagnosis not present

## 2021-03-19 DIAGNOSIS — Z6833 Body mass index (BMI) 33.0-33.9, adult: Secondary | ICD-10-CM | POA: Diagnosis not present

## 2021-03-19 DIAGNOSIS — Z8616 Personal history of COVID-19: Secondary | ICD-10-CM

## 2021-03-19 DIAGNOSIS — Z87891 Personal history of nicotine dependence: Secondary | ICD-10-CM

## 2021-03-19 DIAGNOSIS — R0600 Dyspnea, unspecified: Secondary | ICD-10-CM

## 2021-03-19 DIAGNOSIS — R0609 Other forms of dyspnea: Secondary | ICD-10-CM

## 2021-03-19 NOTE — Patient Instructions (Signed)
Thank you for visiting Dr. Khi Mcmillen at [facility]. Today we recommend the following:  Return if symptoms worsen or fail to improve.    Please do your part to reduce the spread of COVID-19.  

## 2021-03-19 NOTE — Progress Notes (Signed)
Synopsis: Referred in June 2022 for shortness of breath by [name], DO  Subjective:   PATIENT ID: Ian Mathis GENDER: male DOB: 10-20-1970, MRN: [id number]  Chief Complaint  Patient presents with   Consult    Was having [place] since Oct when he had covid.  He stated that he has had covid 2 other times since October but feels that his [facility] is getting some better.     This is a 50 year old gentleman, past medical history of hypertension, hyperlipidemia, obesity, chronic pain, spinal stimulator, seen by Dr. Harriet Masson from cardiology.  Recently hospitalized at [facility] where he had presented with syncope.  Had a positive stress test.  Also positive for COVID-19 at the time.  Discharged home with outpatient cardiology follow-up.  Intermittent chest discomfort.  Patient was referred for left heart catheterization.  Due to patient's smoking history and concern for potential underlying COPD patient was referred to pulmonary.  OV 03/19/2021: Here today to establish care with primary pulmonologist. He has had covid three times in the past 2.5 years. He last bout was in January this year. He had lots of trouble breathing. He is a former smoker, quit smoking in 20s, 30ish years ago. Total smoked only about 5 years added together, <1ppd. Retired Air cabin crew, in [employer]. Younger he worked with his dad at a [employer].    Past Medical History:  Diagnosis Date   Abnormal stress test 01/31/2021   Chest pain of uncertain etiology 6/96/7893   Chronic nonalcoholic liver disease 05/02/1750   Chronic pain syndrome 10/04/2013   DDD (degenerative disc disease), lumbosacral 05/14/2013   Decreased muscle tone 03/31/2014   Dysthymic disorder 06/16/2013   Encounter for therapeutic drug level monitoring 09/28/2014   Essential tremor 01/15/2016   Generalized anxiety disorder 05/14/2013   Hypertension 03/07/2017   Low back pain 09/03/2013   Mixed emotional features as adjustment reaction 06/16/2013    Mixed hyperlipidemia 01/31/2021   Multiple dysplastic nevi 08/08/2017   Muscular deconditioning 03/31/2014   Obesity (BMI 30-39.9) 01/31/2021   Pain disorders related to psychological factors 09/10/2013   Pain in soft tissues of limb 09/03/2013   Pain medication agreement 07/26/2014   Pain of right hip joint 03/31/2014   Postoperative heterotopic calcification 05/14/2013   Psychogenic pain 09/10/2013   S/P insertion of spinal cord stimulator 09/22/2017   Surgery follow-up examination 09/10/2013     No family history on file.   Past Surgical History:  Procedure Laterality Date   LEFT HEART CATH AND CORONARY ANGIOGRAPHY N/A 02/02/2021   Procedure: LEFT HEART CATH AND CORONARY ANGIOGRAPHY;  Surgeon: Martinique, Peter M, MD;  Location: [facility];  Service: Cardiovascular;  Laterality: N/A;    Social History   Socioeconomic History   Marital status: Significant Other    Spouse name: Not on file   Number of children: Not on file   Years of education: Not on file   Highest education level: Not on file  Occupational History   Not on file  Tobacco Use   Smoking status: Former    Packs/day: 1.00    Years: 10.00    Pack years: 10.00    Types: Cigarettes    Quit date: 59    Years since quitting: 24.5   Smokeless tobacco: Current    Types: Snuff  Substance and Sexual Activity   Alcohol use: Not on file   Drug use: Not on file   Sexual activity: Not on file  Other Topics  Concern   Not on file  Social History Narrative   Not on file   Social Determinants of Health   Financial Resource Strain: Not on file  Food Insecurity: Not on file  Transportation Needs: Not on file  Physical Activity: Not on file  Stress: Not on file  Social Connections: Not on file  Intimate Partner Violence: Not on file     Allergies  Allergen Reactions   Atorvastatin Other (See Comments)    unknown   Crestor [Rosuvastatin]    Zolpidem Other (See Comments)    Hallucinations  Hallucinations        Outpatient Medications Prior to Visit  Medication Sig Dispense Refill   baclofen (LIORESAL) 10 MG tablet Take 10 mg by mouth 3 (three) times daily.     FLUoxetine (PROZAC) 20 MG capsule Take 20 mg by mouth every morning.     HYDROcodone-acetaminophen (NORCO) 10-325 MG tablet Take 1 tablet by mouth every 8 (eight) hours as needed for moderate pain or severe pain.     meloxicam (MOBIC) 15 MG tablet Take 15 mg by mouth daily.     metoprolol tartrate (LOPRESSOR) 25 MG tablet Take 0.5 tablets (12.5 mg total) by mouth 2 (two) times daily. 90 tablet 3   morphine (MS CONTIN) 30 MG 12 hr tablet Take 30 mg by mouth 2 (two) times daily as needed for pain.     nitroGLYCERIN (NITROSTAT) 0.3 MG SL tablet Place 1 tablet under the tongue as needed for chest pain.     OLANZapine (ZYPREXA) 5 MG tablet Take 5 mg by mouth at bedtime.     traZODone (DESYREL) 150 MG tablet Take 300 mg by mouth at bedtime.     No facility-administered medications prior to visit.    Review of Systems  Constitutional:  Negative for chills, fever, malaise/fatigue and weight loss.  HENT:  Negative for hearing loss, sore throat and tinnitus.   Eyes:  Negative for blurred vision and double vision.  Respiratory:  Positive for shortness of breath. Negative for cough, hemoptysis, sputum production, wheezing and stridor.   Cardiovascular:  Negative for chest pain, palpitations, orthopnea, leg swelling and PND.  Gastrointestinal:  Negative for abdominal pain, constipation, diarrhea, heartburn, nausea and vomiting.  Genitourinary:  Negative for dysuria, hematuria and urgency.  Musculoskeletal:  Negative for joint pain and myalgias.  Skin:  Negative for itching and rash.  Neurological:  Negative for dizziness, tingling, weakness and headaches.  Endo/Heme/Allergies:  Negative for environmental allergies. Does not bruise/bleed easily.  Psychiatric/Behavioral:  Negative for depression. The patient is not nervous/anxious and does not  have insomnia.   All other systems reviewed and are negative.   Objective:  Physical Exam Vitals reviewed.  Constitutional:      General: He is not in acute distress.    Appearance: He is well-developed. He is obese.  HENT:     Head: Normocephalic and atraumatic.  Eyes:     General: No scleral icterus.    Conjunctiva/sclera: Conjunctivae normal.     Pupils: Pupils are equal, round, and reactive to light.  Neck:     Vascular: No JVD.     Trachea: No tracheal deviation.  Cardiovascular:     Rate and Rhythm: Normal rate and regular rhythm.     Heart sounds: Normal heart sounds. No murmur heard. Pulmonary:     Effort: Pulmonary effort is normal. No tachypnea, accessory muscle usage or respiratory distress.     Breath sounds: Normal breath sounds. No stridor.  No wheezing, rhonchi or rales.  Abdominal:     General: Bowel sounds are normal. There is no distension.     Palpations: Abdomen is soft.     Tenderness: There is no abdominal tenderness.  Musculoskeletal:        General: No tenderness.     Cervical back: Neck supple.  Lymphadenopathy:     Cervical: No cervical adenopathy.  Skin:    General: Skin is warm and dry.     Capillary Refill: Capillary refill takes less than 2 seconds.     Findings: No rash.  Neurological:     Mental Status: He is alert and oriented to person, place, and time.  Psychiatric:        Behavior: Behavior normal.     Vitals:   03/19/21 1057  BP: 110/80  Pulse: 75  Temp: 98 F (36.7 C)  TempSrc: Oral  SpO2: 97%  Weight: 244 lb 4 oz (110.8 kg)  Height: 6' (1.829 m)   97% on RA BMI Readings from Last 3 Encounters:  03/19/21 33.13 kg/m  02/14/21 32.55 kg/m  02/02/21 33.23 kg/m   Wt Readings from Last 3 Encounters:  03/19/21 244 lb 4 oz (110.8 kg)  02/14/21 240 lb (108.9 kg)  02/02/21 245 lb (111.1 kg)     CBC    Component Value Date/Time   WBC 6.5 01/31/2021 0921   WBC 10.3 11/05/2007 1620   RBC 5.34 01/31/2021 0921   RBC  4.39 11/05/2007 1620   HGB 16.7 01/31/2021 0921   HCT 48.5 01/31/2021 0921   PLT 199 01/31/2021 0921   MCV 91 01/31/2021 0921   MCH 31.3 01/31/2021 0921   MCHC 34.4 01/31/2021 0921   MCHC 34.3 11/05/2007 1620   RDW 11.8 01/31/2021 0921   LYMPHSABS 1.9 01/31/2021 0921   MONOABS 0.7 11/05/2007 1620   EOSABS 0.1 01/31/2021 0921   BASOSABS 0.1 01/31/2021 0921     Chest Imaging: CT chest 01/16/2021: Report reviewed, images reviewed in PACS. Lungs clear no significant parenchymal changes.  No adenopathy. No infiltrate. No PE. The patient's images have been independently reviewed by me.    Pulmonary Functions Testing Results: No flowsheet data found.  FeNO:   Pathology:   Echocardiogram:   Heart Catheterization:     Assessment & Plan:     ICD-10-CM   1. SOB (shortness of breath)  R06.02     2. DOE (dyspnea on exertion)  R06.00     3. BMI 33.0-33.9,adult  Z68.33     4. History of COVID-19  Z86.16     5. Former smoker  Z87.891      Discussion:  This is a 50 year old gentleman that presents today with complaints of dyspnea on exertion or shortness of breath.  Overall it has been improving since he was referred.  He had a cardiology work-up which was negative here.  He had CT scan of the chest which was unrevealing.  Images reviewed today in the office.  He has not had PFTs.  He is a former smoker that quit in his 2s greater than 30+ years ago with only about 5 years of smoking less than a half a pack a day.  At this time he has no additional warning symptoms and is getting better.  Plan: We discussed various options to include additional work-up to include pulmonary function tests or more conservative approach with watchful waiting. Patient would like to wait at least 6 months. If he continues to improve and symptoms  dissipate completely he will let us know. I explained that if his symptoms persist or get worse would recommend coming back to see [place] and we would obtain  pulmonary function test. Patient is agreeable to this plan.  We also discussed his weight and he will work on exercising, watching dietary intake and trying to lose weight.   Current Outpatient Medications:    baclofen (LIORESAL) 10 MG tablet, Take 10 mg by mouth 3 (three) times daily., Disp: , Rfl:    FLUoxetine (PROZAC) 20 MG capsule, Take 20 mg by mouth every morning., Disp: , Rfl:    HYDROcodone-acetaminophen (NORCO) 10-325 MG tablet, Take 1 tablet by mouth every 8 (eight) hours as needed for moderate pain or severe pain., Disp: , Rfl:    meloxicam (MOBIC) 15 MG tablet, Take 15 mg by mouth daily., Disp: , Rfl:    metoprolol tartrate (LOPRESSOR) 25 MG tablet, Take 0.5 tablets (12.5 mg total) by mouth 2 (two) times daily., Disp: 90 tablet, Rfl: 3   morphine (MS CONTIN) 30 MG 12 hr tablet, Take 30 mg by mouth 2 (two) times daily as needed for pain., Disp: , Rfl:    nitroGLYCERIN (NITROSTAT) 0.3 MG SL tablet, Place 1 tablet under the tongue as needed for chest pain., Disp: , Rfl:    OLANZapine (ZYPREXA) 5 MG tablet, Take 5 mg by mouth at bedtime., Disp: , Rfl:    traZODone (DESYREL) 150 MG tablet, Take 300 mg by mouth at bedtime., Disp: , Rfl:    Garner Nash, DO [facility] 03/19/2021 11:06 AM

## 2021-03-29 ENCOUNTER — Ambulatory Visit: Payer: Medicare HMO | Admitting: Cardiology
